# Patient Record
Sex: Female | Born: 1940 | Race: White | Hispanic: No | Smoking: Former smoker
Health system: Southern US, Community
[De-identification: ages and names within clinical notes are randomized; demographics above are authoritative.]

## PROBLEM LIST (undated history)

## (undated) DIAGNOSIS — I4891 Unspecified atrial fibrillation: Secondary | ICD-10-CM

## (undated) DIAGNOSIS — Z95828 Presence of other vascular implants and grafts: Secondary | ICD-10-CM

## (undated) DIAGNOSIS — G8190 Hemiplegia, unspecified affecting unspecified side: Secondary | ICD-10-CM

## (undated) DIAGNOSIS — M21969 Unspecified acquired deformity of unspecified lower leg: Secondary | ICD-10-CM

## (undated) DIAGNOSIS — K589 Irritable bowel syndrome without diarrhea: Secondary | ICD-10-CM

## (undated) DIAGNOSIS — R9430 Abnormal result of cardiovascular function study, unspecified: Secondary | ICD-10-CM

## (undated) DIAGNOSIS — IMO0002 Reserved for concepts with insufficient information to code with codable children: Secondary | ICD-10-CM

## (undated) DIAGNOSIS — Z8669 Personal history of other diseases of the nervous system and sense organs: Secondary | ICD-10-CM

## (undated) DIAGNOSIS — F0390 Unspecified dementia without behavioral disturbance: Secondary | ICD-10-CM

## (undated) DIAGNOSIS — F329 Major depressive disorder, single episode, unspecified: Secondary | ICD-10-CM

## (undated) DIAGNOSIS — I5189 Other ill-defined heart diseases: Secondary | ICD-10-CM

## (undated) DIAGNOSIS — F32A Depression, unspecified: Secondary | ICD-10-CM

## (undated) DIAGNOSIS — I351 Nonrheumatic aortic (valve) insufficiency: Secondary | ICD-10-CM

## (undated) DIAGNOSIS — J80 Acute respiratory distress syndrome: Secondary | ICD-10-CM

## (undated) DIAGNOSIS — I639 Cerebral infarction, unspecified: Secondary | ICD-10-CM

## (undated) DIAGNOSIS — D65 Disseminated intravascular coagulation [defibrination syndrome]: Secondary | ICD-10-CM

## (undated) DIAGNOSIS — E039 Hypothyroidism, unspecified: Secondary | ICD-10-CM

## (undated) DIAGNOSIS — I340 Nonrheumatic mitral (valve) insufficiency: Secondary | ICD-10-CM

## (undated) DIAGNOSIS — F419 Anxiety disorder, unspecified: Secondary | ICD-10-CM

## (undated) HISTORY — PX: APPENDECTOMY: SHX54

## (undated) HISTORY — DX: Presence of other vascular implants and grafts: Z95.828

## (undated) HISTORY — PX: FOOT SURGERY: SHX648

## (undated) HISTORY — PX: TONSILLECTOMY: SUR1361

## (undated) HISTORY — DX: Abnormal result of cardiovascular function study, unspecified: R94.30

## (undated) HISTORY — PX: OTHER SURGICAL HISTORY: SHX169

## (undated) HISTORY — DX: Nonrheumatic aortic (valve) insufficiency: I35.1

## (undated) HISTORY — DX: Nonrheumatic mitral (valve) insufficiency: I34.0

## (undated) HISTORY — DX: Unspecified acquired deformity of unspecified lower leg: M21.969

## (undated) HISTORY — DX: Unspecified atrial fibrillation: I48.91

## (undated) HISTORY — DX: Irritable bowel syndrome, unspecified: K58.9

## (undated) HISTORY — DX: Reserved for concepts with insufficient information to code with codable children: IMO0002

## (undated) HISTORY — DX: Other ill-defined heart diseases: I51.89

---

## 2007-01-16 ENCOUNTER — Ambulatory Visit: Payer: Self-pay | Admitting: Cardiology

## 2007-04-22 ENCOUNTER — Ambulatory Visit: Payer: Self-pay

## 2007-04-22 ENCOUNTER — Encounter: Payer: Self-pay | Admitting: Cardiology

## 2007-06-05 ENCOUNTER — Ambulatory Visit: Payer: Self-pay | Admitting: Cardiology

## 2007-06-07 ENCOUNTER — Emergency Department (HOSPITAL_COMMUNITY): Admission: EM | Admit: 2007-06-07 | Discharge: 2007-06-07 | Payer: Self-pay | Admitting: General Surgery

## 2007-06-30 ENCOUNTER — Emergency Department (HOSPITAL_COMMUNITY): Admission: EM | Admit: 2007-06-30 | Discharge: 2007-06-30 | Payer: Self-pay | Admitting: Emergency Medicine

## 2007-10-10 ENCOUNTER — Telehealth (INDEPENDENT_AMBULATORY_CARE_PROVIDER_SITE_OTHER): Payer: Self-pay | Admitting: *Deleted

## 2007-12-10 ENCOUNTER — Other Ambulatory Visit: Payer: Self-pay | Admitting: Emergency Medicine

## 2007-12-10 ENCOUNTER — Other Ambulatory Visit: Payer: Self-pay

## 2007-12-10 ENCOUNTER — Inpatient Hospital Stay (HOSPITAL_COMMUNITY): Admission: RE | Admit: 2007-12-10 | Discharge: 2007-12-12 | Payer: Self-pay | Admitting: Psychiatry

## 2007-12-10 ENCOUNTER — Ambulatory Visit: Payer: Self-pay | Admitting: Psychiatry

## 2008-01-15 ENCOUNTER — Ambulatory Visit: Payer: Self-pay | Admitting: Internal Medicine

## 2008-01-15 DIAGNOSIS — I69959 Hemiplegia and hemiparesis following unspecified cerebrovascular disease affecting unspecified side: Secondary | ICD-10-CM | POA: Insufficient documentation

## 2008-01-15 DIAGNOSIS — F341 Dysthymic disorder: Secondary | ICD-10-CM | POA: Insufficient documentation

## 2008-01-15 DIAGNOSIS — M549 Dorsalgia, unspecified: Secondary | ICD-10-CM | POA: Insufficient documentation

## 2008-01-15 DIAGNOSIS — K352 Acute appendicitis with generalized peritonitis, without abscess: Secondary | ICD-10-CM

## 2008-01-15 DIAGNOSIS — E039 Hypothyroidism, unspecified: Secondary | ICD-10-CM | POA: Insufficient documentation

## 2008-01-15 DIAGNOSIS — J984 Other disorders of lung: Secondary | ICD-10-CM | POA: Insufficient documentation

## 2008-03-29 ENCOUNTER — Ambulatory Visit (HOSPITAL_BASED_OUTPATIENT_CLINIC_OR_DEPARTMENT_OTHER): Admission: RE | Admit: 2008-03-29 | Discharge: 2008-03-29 | Payer: Self-pay | Admitting: Internal Medicine

## 2008-03-29 ENCOUNTER — Ambulatory Visit: Payer: Self-pay | Admitting: Internal Medicine

## 2008-03-29 DIAGNOSIS — K5909 Other constipation: Secondary | ICD-10-CM | POA: Insufficient documentation

## 2008-03-29 DIAGNOSIS — M25569 Pain in unspecified knee: Secondary | ICD-10-CM | POA: Insufficient documentation

## 2008-03-29 LAB — CONVERTED CEMR LAB
ALT: 22 units/L (ref 0–35)
AST: 19 units/L (ref 0–37)
Albumin: 4 g/dL (ref 3.5–5.2)
BUN: 21 mg/dL (ref 6–23)
Basophils Absolute: 0 10*3/uL (ref 0.0–0.1)
Basophils Relative: 0.4 % (ref 0.0–3.0)
CO2: 28 meq/L (ref 19–32)
Calcium: 9 mg/dL (ref 8.4–10.5)
Creatinine, Ser: 0.8 mg/dL (ref 0.4–1.2)
Eosinophils Absolute: 0.1 10*3/uL (ref 0.0–0.7)
Eosinophils Relative: 1.6 % (ref 0.0–5.0)
GFR calc non Af Amer: 77 mL/min
Hemoglobin: 12.8 g/dL (ref 12.0–15.0)
MCHC: 35.1 g/dL (ref 30.0–36.0)
MCV: 91.8 fL (ref 78.0–100.0)
Neutro Abs: 4 10*3/uL (ref 1.4–7.7)
RBC: 3.99 M/uL (ref 3.87–5.11)
TSH: 1.49 microintl units/mL (ref 0.35–5.50)
Total Bilirubin: 0.4 mg/dL (ref 0.3–1.2)
WBC: 6.7 10*3/uL (ref 4.5–10.5)

## 2008-03-30 ENCOUNTER — Telehealth: Payer: Self-pay | Admitting: Internal Medicine

## 2008-06-28 ENCOUNTER — Telehealth: Payer: Self-pay | Admitting: Internal Medicine

## 2008-06-29 ENCOUNTER — Telehealth (INDEPENDENT_AMBULATORY_CARE_PROVIDER_SITE_OTHER): Payer: Self-pay | Admitting: *Deleted

## 2008-07-09 ENCOUNTER — Ambulatory Visit: Payer: Self-pay | Admitting: Cardiology

## 2008-07-16 ENCOUNTER — Ambulatory Visit: Payer: Self-pay | Admitting: Internal Medicine

## 2008-07-16 ENCOUNTER — Telehealth: Payer: Self-pay | Admitting: Internal Medicine

## 2008-07-16 DIAGNOSIS — M810 Age-related osteoporosis without current pathological fracture: Secondary | ICD-10-CM | POA: Insufficient documentation

## 2008-07-25 ENCOUNTER — Ambulatory Visit: Payer: Self-pay | Admitting: Diagnostic Radiology

## 2008-07-25 ENCOUNTER — Emergency Department (HOSPITAL_BASED_OUTPATIENT_CLINIC_OR_DEPARTMENT_OTHER): Admission: EM | Admit: 2008-07-25 | Discharge: 2008-07-25 | Payer: Self-pay | Admitting: Emergency Medicine

## 2008-08-04 ENCOUNTER — Emergency Department (HOSPITAL_BASED_OUTPATIENT_CLINIC_OR_DEPARTMENT_OTHER): Admission: EM | Admit: 2008-08-04 | Discharge: 2008-08-04 | Payer: Self-pay | Admitting: Emergency Medicine

## 2008-09-07 IMAGING — CR DG CHEST 1V PORT
1 series · 1 of 1 positions shown · non-contrast
Comparison: 06/07/07.

CLINICAL DATA: Altered level of consciousness.
 PORTABLE CHEST:

[AP]
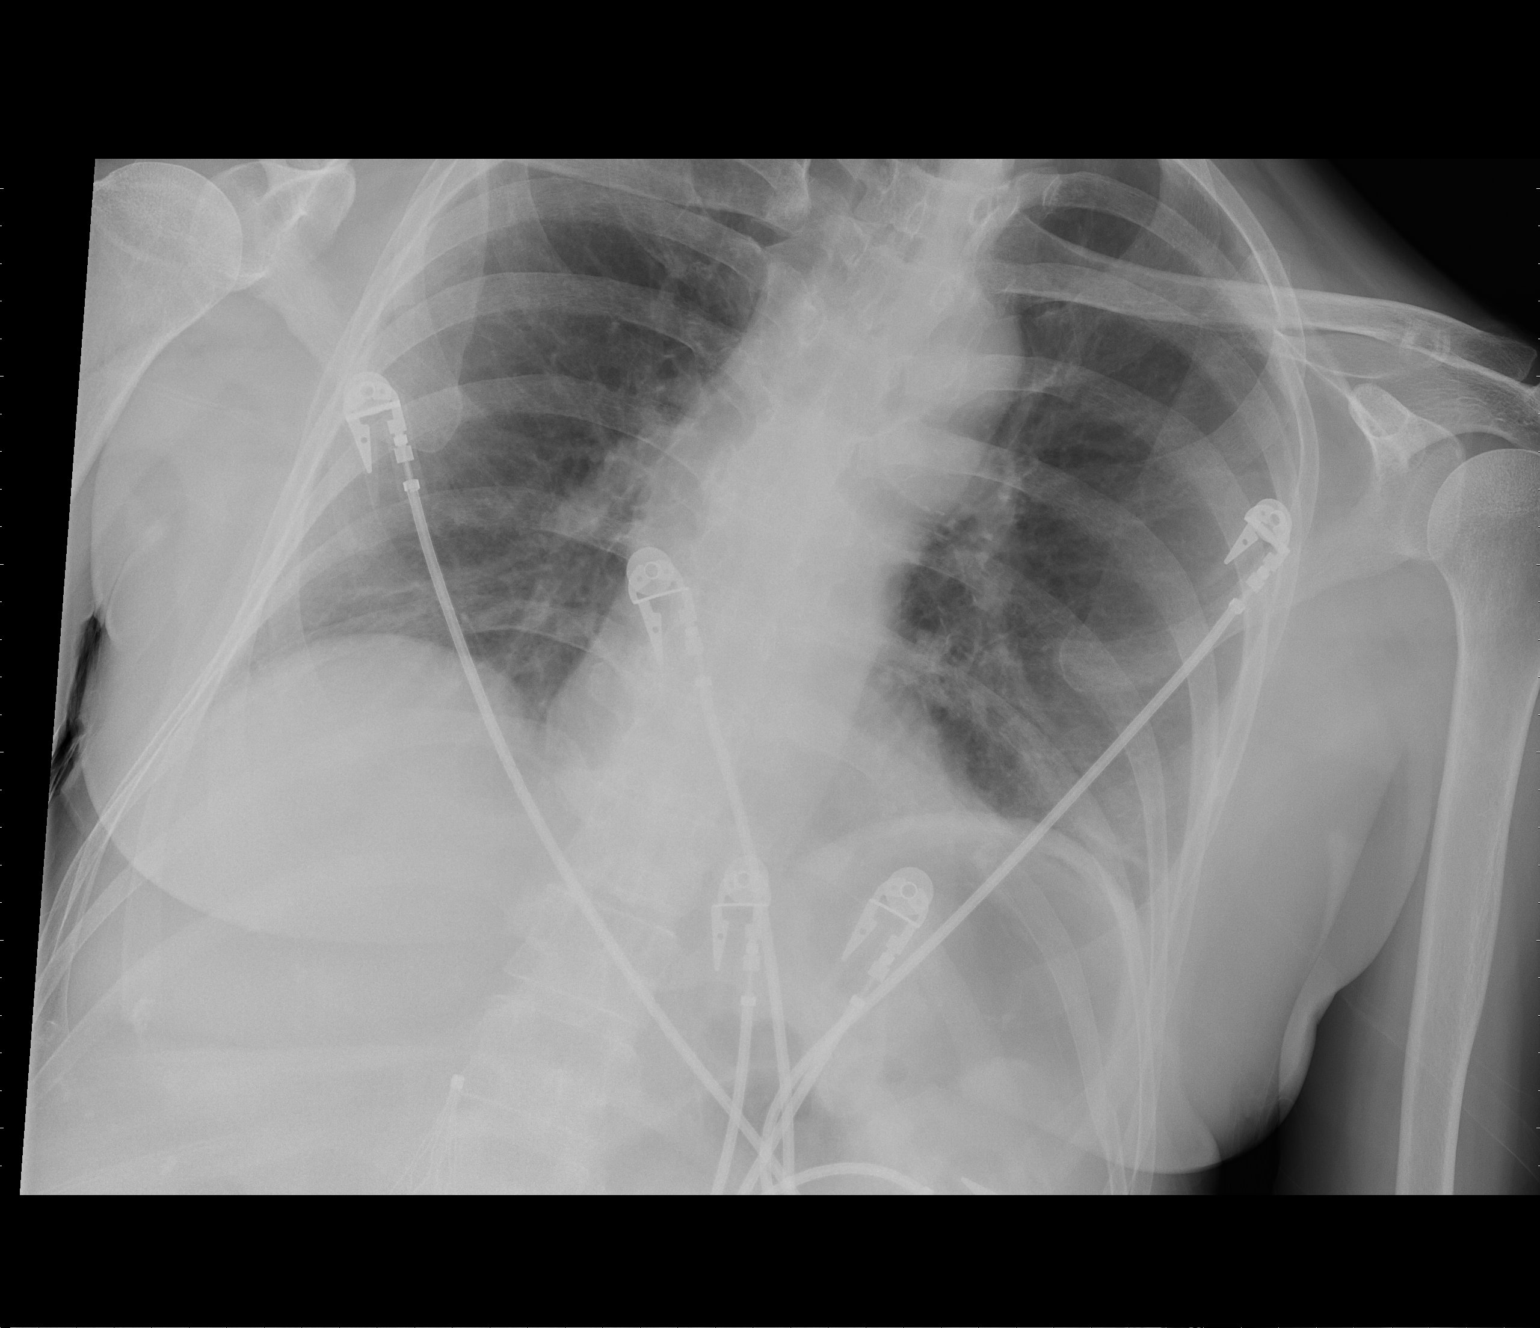

[1 of 1 positions shown; findings below may reference images not displayed]

FINDINGS: The cardiomediastinal silhouette is stable.  The study is limited by poor inspiration.  There is bilateral basilar atelectasis.  There is no acute infiltrate or pleural effusion.  There is no pulmonary edema.  An IVC filter is noted in visualized abdomen.
IMPRESSION: Limited exam by poor inspiration.  No acute infiltrate.  Bilateral basilar atelectasis.

## 2008-09-07 IMAGING — CT CT HEAD W/O CM
1 series · 16 of 30 positions shown, 20 images · non-contrast
Comparison: None

CLINICAL DATA: Altered level of consciousness

HEAD CT WITHOUT CONTRAST
TECHNIQUE: 5mm collimated images were obtained from the base of the skull
through the vertex according to standard protocol without contrast.

[Series 2: head routine 4.8 h47s · axial · 0.39mm/px · z∈[-115,+16]mm · 16 of 30 slices shown, 20 images]
[im 2/30  brain]
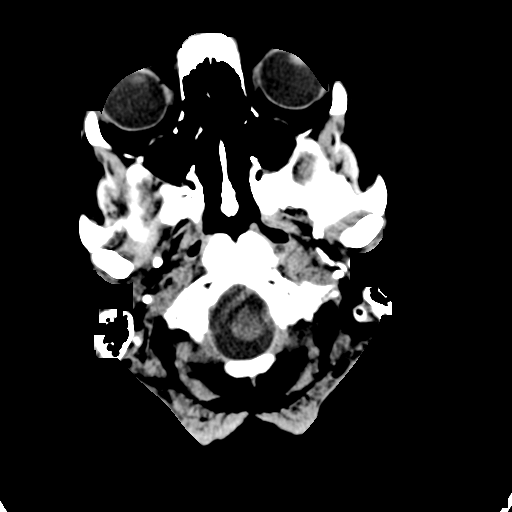
[im 2/30  bone]
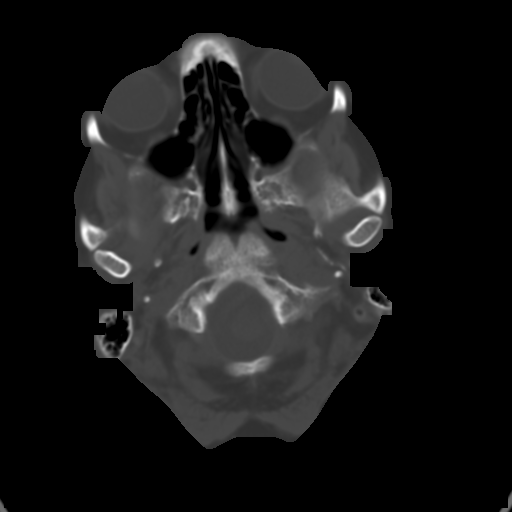
[im 4/30  brain]
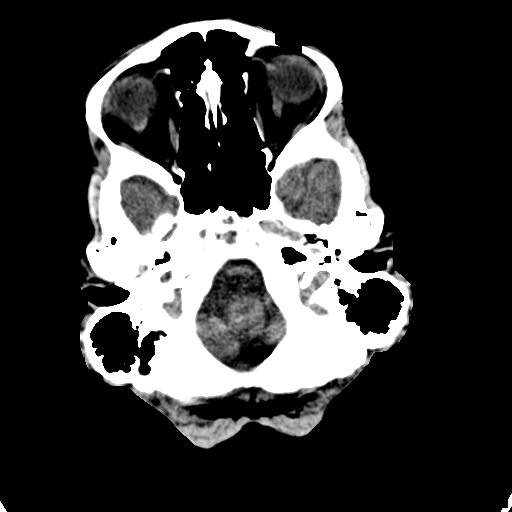
[im 6/30  brain]
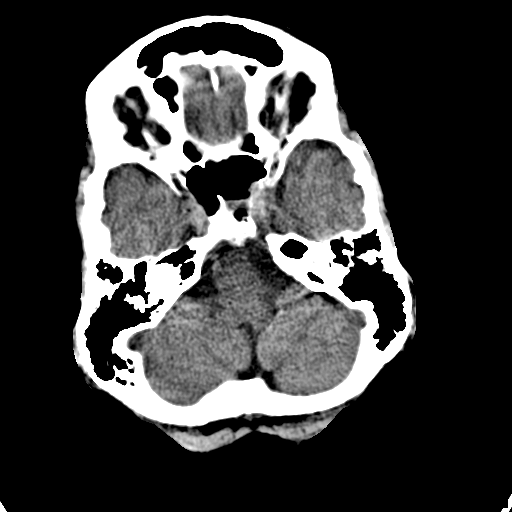
[im 8/30  brain]
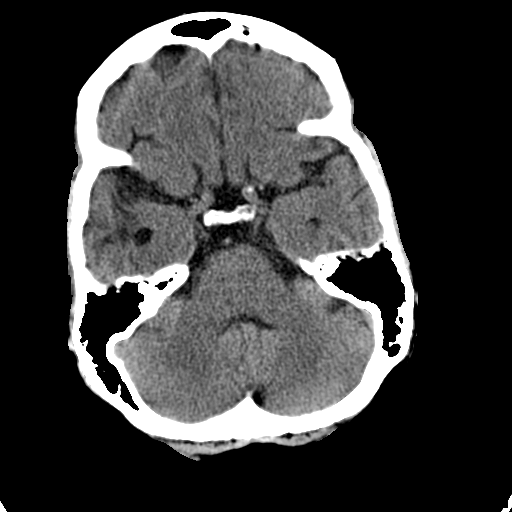
[im 9/30  brain]
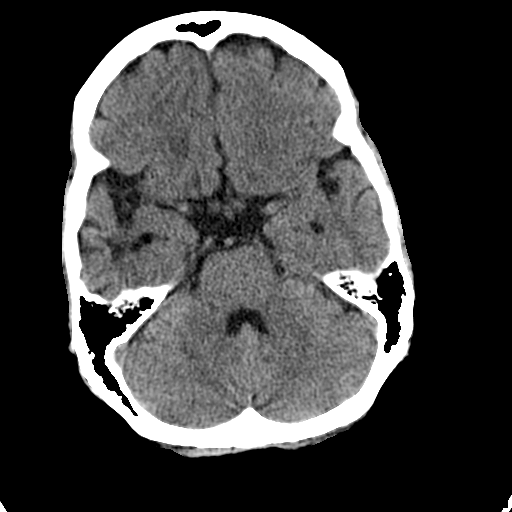
[im 9/30  bone]
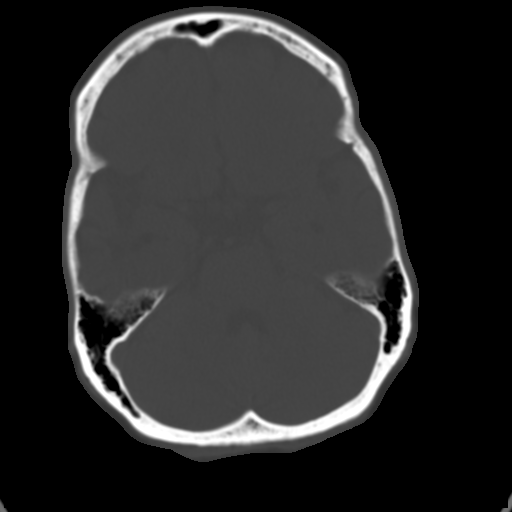
[im 11/30  brain]
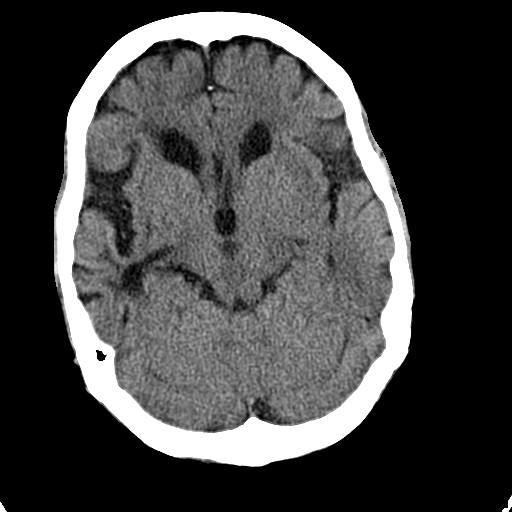
[im 13/30  brain]
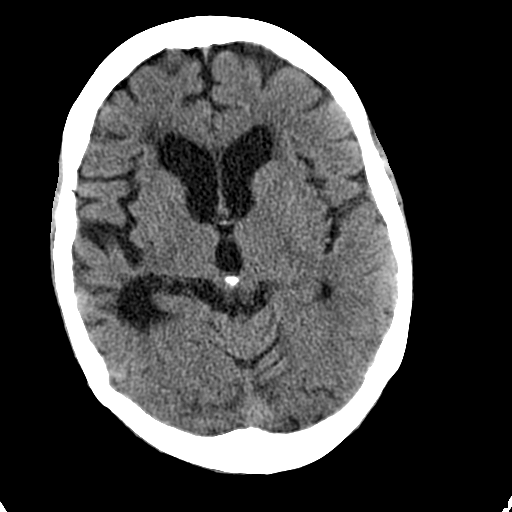
[im 15/30  brain]
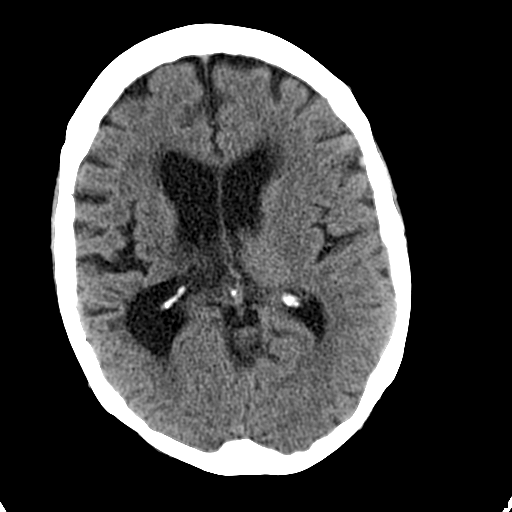
[im 16/30  brain]
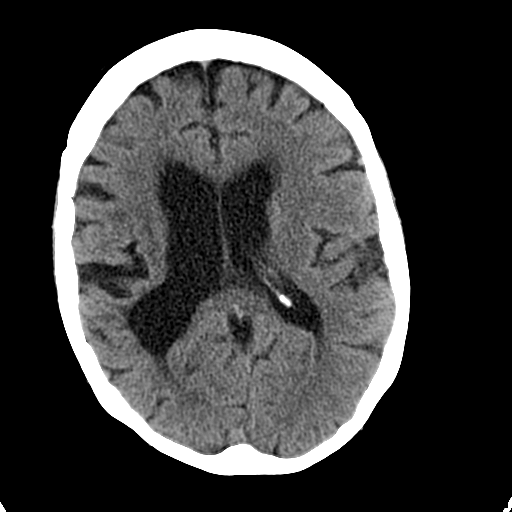
[im 16/30  bone]
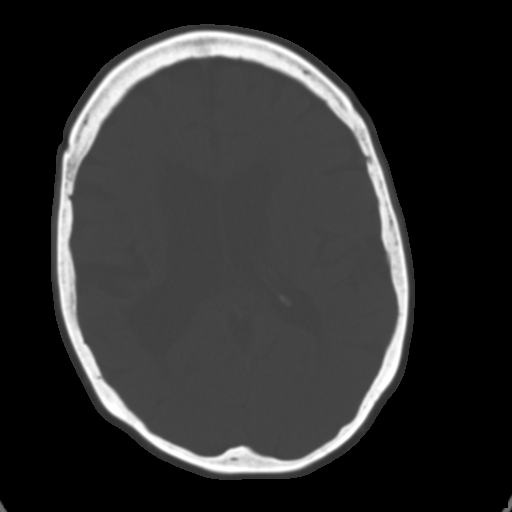
[im 18/30  brain]
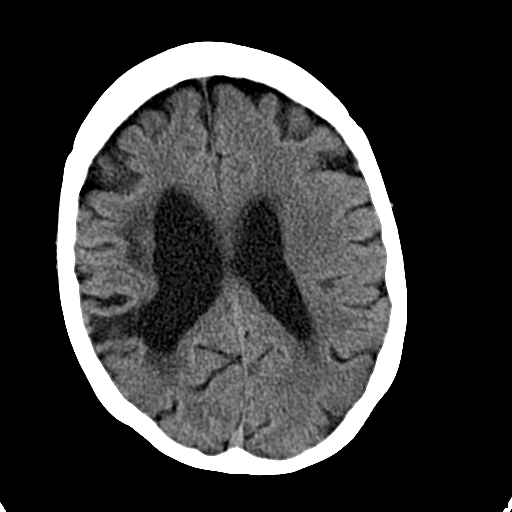
[im 20/30  brain]
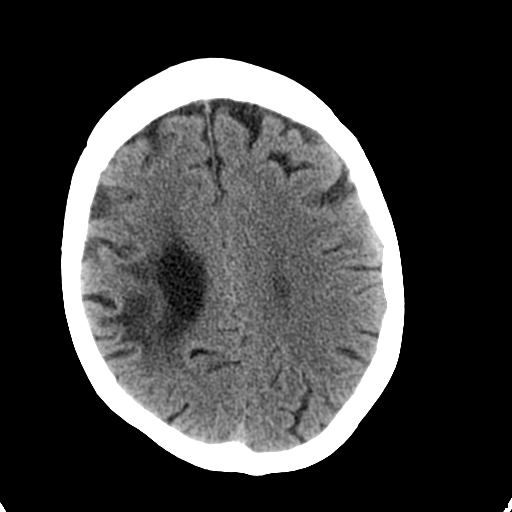
[im 22/30  brain]
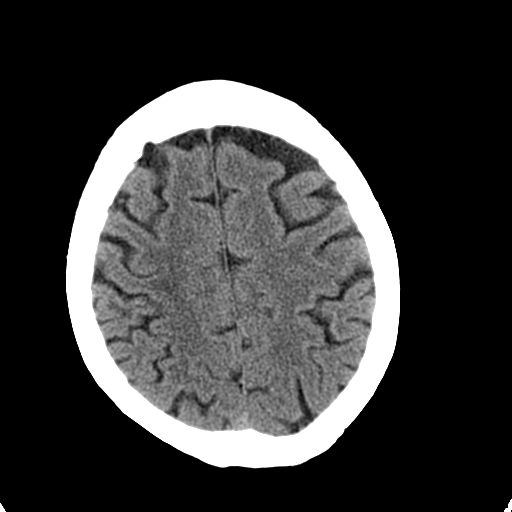
[im 23/30  brain]
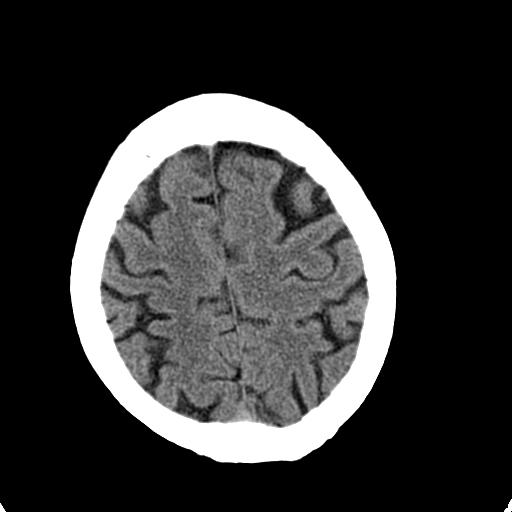
[im 23/30  bone]
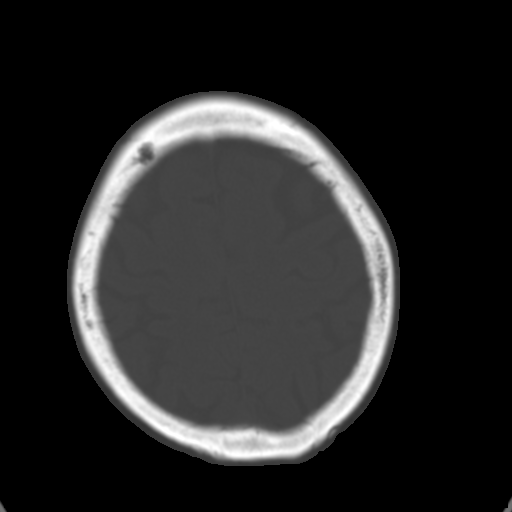
[im 25/30  brain]
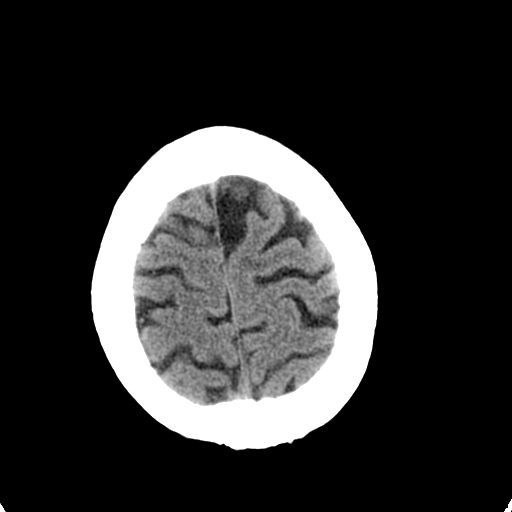
[im 27/30  brain]
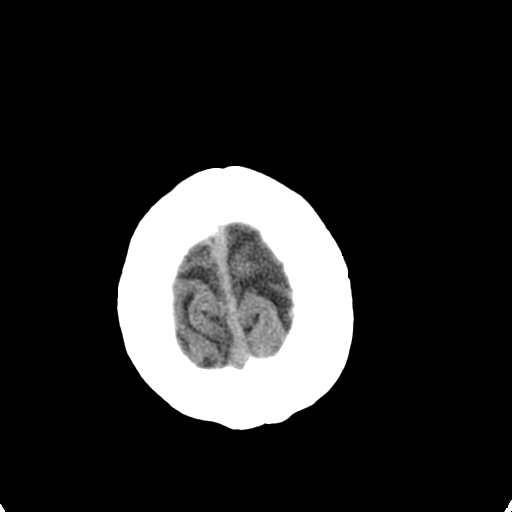
[im 29/30  brain]
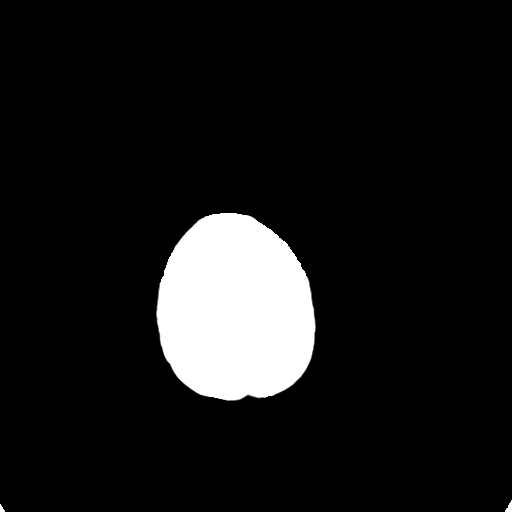

[16 of 30 positions shown; findings below may reference images not displayed]

FINDINGS: There is advanced cerebral atrophy. Chronic small vessel disease
changes noted in the deep white matter as well, right greater than left. There
is old right MCA infarct in the posterior right temporal lobe. No hemorrhage,
hydrocephalus, mass lesion, or acute infarct. Visualized paranasal sinuses and
calvarium unremarkable.

IMPRESSION

Advanced atrophy and chronic small vessel disease.

Old small right MCA infarct.

No acute intracranial abnormality.

## 2008-10-08 ENCOUNTER — Telehealth: Payer: Self-pay | Admitting: Cardiology

## 2008-10-20 ENCOUNTER — Telehealth: Payer: Self-pay | Admitting: Cardiology

## 2008-11-21 ENCOUNTER — Emergency Department (HOSPITAL_BASED_OUTPATIENT_CLINIC_OR_DEPARTMENT_OTHER): Admission: EM | Admit: 2008-11-21 | Discharge: 2008-11-21 | Payer: Self-pay | Admitting: Emergency Medicine

## 2008-11-21 ENCOUNTER — Ambulatory Visit: Payer: Self-pay | Admitting: Diagnostic Radiology

## 2009-05-03 ENCOUNTER — Ambulatory Visit: Payer: Self-pay | Admitting: Diagnostic Radiology

## 2009-05-03 ENCOUNTER — Emergency Department (HOSPITAL_BASED_OUTPATIENT_CLINIC_OR_DEPARTMENT_OTHER): Admission: EM | Admit: 2009-05-03 | Discharge: 2009-05-03 | Payer: Self-pay | Admitting: Emergency Medicine

## 2009-05-12 ENCOUNTER — Encounter (INDEPENDENT_AMBULATORY_CARE_PROVIDER_SITE_OTHER): Payer: Self-pay | Admitting: *Deleted

## 2009-05-17 ENCOUNTER — Emergency Department (HOSPITAL_BASED_OUTPATIENT_CLINIC_OR_DEPARTMENT_OTHER): Admission: EM | Admit: 2009-05-17 | Discharge: 2009-05-17 | Payer: Self-pay | Admitting: Emergency Medicine

## 2009-11-08 ENCOUNTER — Telehealth: Payer: Self-pay | Admitting: Cardiology

## 2009-11-10 ENCOUNTER — Encounter (INDEPENDENT_AMBULATORY_CARE_PROVIDER_SITE_OTHER): Payer: Self-pay | Admitting: *Deleted

## 2009-11-17 ENCOUNTER — Telehealth (INDEPENDENT_AMBULATORY_CARE_PROVIDER_SITE_OTHER): Payer: Self-pay | Admitting: *Deleted

## 2009-11-17 ENCOUNTER — Telehealth: Payer: Self-pay | Admitting: Cardiology

## 2010-01-11 ENCOUNTER — Encounter: Payer: Self-pay | Admitting: Cardiology

## 2010-03-22 ENCOUNTER — Encounter: Payer: Self-pay | Admitting: Cardiology

## 2010-03-23 ENCOUNTER — Ambulatory Visit: Payer: Self-pay | Admitting: Cardiology

## 2010-05-30 ENCOUNTER — Emergency Department (HOSPITAL_BASED_OUTPATIENT_CLINIC_OR_DEPARTMENT_OTHER)
Admission: EM | Admit: 2010-05-30 | Discharge: 2010-05-30 | Disposition: A | Discharge status: Other Acute Inpatient Hospital | Payer: Self-pay | Source: Home / Self Care | Admitting: Emergency Medicine

## 2010-05-30 ENCOUNTER — Inpatient Hospital Stay (HOSPITAL_COMMUNITY)
Admission: EM | Admit: 2010-05-30 | Discharge: 2010-06-02 | Payer: Self-pay | Attending: Internal Medicine | Admitting: Internal Medicine

## 2010-05-31 LAB — COMPREHENSIVE METABOLIC PANEL
ALT: 23 U/L (ref 0–35)
AST: 21 U/L (ref 0–37)
Albumin: 4.1 g/dL (ref 3.5–5.2)
Alkaline Phosphatase: 74 U/L (ref 39–117)
BUN: 18 mg/dL (ref 6–23)
CO2: 26 mEq/L (ref 19–32)
Calcium: 9.5 mg/dL (ref 8.4–10.5)
Chloride: 109 mEq/L (ref 96–112)
Creatinine, Ser: 0.8 mg/dL (ref 0.4–1.2)
GFR calc Af Amer: >60 mL/min (ref >60)
GFR calc non Af Amer: >60 mL/min (ref >60)
Glucose, Bld: 101 mg/dL — ABNORMAL HIGH (ref 70–99)
Potassium: 4.1 mEq/L (ref 3.5–5.1)
Sodium: 147 mEq/L — ABNORMAL HIGH (ref 135–145)
Total Bilirubin: 0.5 mg/dL (ref 0.3–1.2)
Total Protein: 6.9 g/dL (ref 6.0–8.3)

## 2010-05-31 LAB — PROCALCITONIN: Procalcitonin: <0.1 ng/mL

## 2010-05-31 LAB — URINE MICROSCOPIC-ADD ON

## 2010-05-31 LAB — CBC
HCT: 38.2 % (ref 36.0–46.0)
MCH: 29.1 pg (ref 26.0–34.0)
MCHC: 33.2 g/dL (ref 30.0–36.0)
MCV: 87.4 fL (ref 78.0–100.0)
Platelets: 289 10*3/uL (ref 150–400)
RBC: 4.37 MIL/uL (ref 3.87–5.11)
WBC: 10.2 10*3/uL (ref 4.0–10.5)

## 2010-05-31 LAB — URINALYSIS, ROUTINE W REFLEX MICROSCOPIC
Bilirubin Urine: NEGATIVE
Hgb urine dipstick: NEGATIVE
Ketones, ur: NEGATIVE mg/dL
Nitrite: NEGATIVE
Protein, ur: NEGATIVE mg/dL
Specific Gravity, Urine: 1.018 (ref 1.005–1.030)
Urine Glucose, Fasting: NEGATIVE mg/dL
Urobilinogen, UA: 1 mg/dL (ref 0.0–1.0)
pH: 6 (ref 5.0–8.0)

## 2010-05-31 LAB — DIFFERENTIAL
Basophils Absolute: 0 10*3/uL (ref 0.0–0.1)
Basophils Relative: 0 % (ref 0–1)
Eosinophils Absolute: 0.3 10*3/uL (ref 0.0–0.7)
Eosinophils Relative: 3 % (ref 0–5)
Lymphocytes Relative: 26 % (ref 12–46)
Lymphs Abs: 2.7 10*3/uL (ref 0.7–4.0)
Neutrophils Relative %: 63 % (ref 43–77)

## 2010-05-31 LAB — LACTIC ACID, PLASMA: Lactic Acid, Venous: 0.9 mmol/L (ref 0.5–2.2)

## 2010-06-02 NOTE — Consult Note (Signed)
[name], MCBREEN NO.:  [id number]  MEDICAL RECORD NO.:  [id number]          PATIENT TYPE:  INP  LOCATION:  4708                         FACILITY:  [facility]  PHYSICIAN:  Alvy Beal, MD    DATE OF BIRTH:  1940-10-06  DATE OF CONSULTATION:  05/31/2010 DATE OF DISCHARGE:                                CONSULTATION   ADMITTING DIAGNOSIS:  Cellulitis, possible osteomyelitis, of the left foot.  HISTORY:  This is a 70 year old woman who presented to the [facility] because of redness, erythema, and warmth in her left foot, fairly acute onset.  The patient states that she had a corn at the base of her left great toe which peeled off while she was in the shower 3 days earlier.  The patient states that she started noticing swelling and tenderness with weightbearing over the last 2 days.  Because of this, she presented to the emergency room.  The patient had been seen by my partner, Dr. Lestine Box, in the past and so I was consulted as the patient has requested Dr. Lestine Box to be involved in her care.  The patient had x-rays which demonstrated a questionable lucency around the screws which were read as either loosening or a possible osteo. There are no other previous x-rays to compare to.  The patient had a multi-complex surgical reconstruction of her foot back somewhere between 9-11 years ago, she is really unclear as to exact date.  Because of the low-grade fever and the swelling, she was transferred to [facility] for ongoing care.  She was admitted by the hospitalist service.  PAST MEDICAL HISTORY: 1. Hypothyroidism. 2. History of stroke. 3. Left-sided hemiparesis. 4. History of ARDS. 5. Acute appendicitis with generalized peritonitis in the past. 6. Depression. 7. Anxiety. 8. Chronic back pain. 9. Paroxysmal atrial fibrillation. 10.Diastolic dysfunction which is chronic. 11.Tricuspid regurgitation. 12.Aortic insufficiency. 13.IVC filter in  2000. 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 She has had an appendectomy, breast biopsy, reconstructive foot surgery in I believe 2001, bladder suspension surgery, IVC filter placement.  She has recently relocated here to [place] area; however, all of her foot surgery was done in [place].  CLINICAL EXAMINATION:  GENERAL:  She is currently in bed resting comfortably.  She has no shortness of breath, chest pain. ABDOMEN:  Soft, nontender. EXTREMITIES:  She has no hip, knee, or ankle pain with joint range of motion, bilaterally intact sensation to light touch throughout.  There is a well-healed surgical scar over the dorsum of the foot.  There is slight plantar dorsal corn over her great toe, but there is no drainage. There is some minor warmth and erythema and swelling, but she does not have significant pain with palpation of the forefoot, midfoot, or hind foot.  X-rays were reviewed.  There does appear to be a questionable lucency which could be read as an infection versus just lucency of the screws.  At this point, this is a very complicated issue.  I do agree with treating the cellulitis which she has with IV antibiotics and immobilization.  I will  discuss the case with my partner, Dr. Lestine Box, and defer definitive ongoing care to him.  I will make sure he is aware of the patient today, and hopefully, he will see her tomorrow.     Alvy Beal, MD     DDB/MEDQ  D:  05/31/2010  T:  06/01/2010  Job:  [id number]  Electronically Signed by Venita Lick MD on 06/01/2010 08:48:10 PM

## 2010-06-04 ENCOUNTER — Emergency Department (HOSPITAL_BASED_OUTPATIENT_CLINIC_OR_DEPARTMENT_OTHER)
Admission: EM | Admit: 2010-06-04 | Discharge: 2010-06-04 | Payer: Self-pay | Source: Home / Self Care | Admitting: Emergency Medicine

## 2010-06-05 LAB — COMPREHENSIVE METABOLIC PANEL
ALT: 13 U/L (ref 0–35)
AST: 15 U/L (ref 0–37)
AST: 13 U/L (ref 0–37)
Albumin: 2.8 g/dL — ABNORMAL LOW (ref 3.5–5.2)
Alkaline Phosphatase: 45 U/L (ref 39–117)
BUN: 13 mg/dL (ref 6–23)
CO2: 24 mEq/L (ref 19–32)
CO2: 26 mEq/L (ref 19–32)
Calcium: 8.3 mg/dL — ABNORMAL LOW (ref 8.4–10.5)
Calcium: 8.3 mg/dL — ABNORMAL LOW (ref 8.4–10.5)
Chloride: 111 mEq/L (ref 96–112)
Creatinine, Ser: 0.86 mg/dL (ref 0.4–1.2)
Creatinine, Ser: 0.84 mg/dL (ref 0.4–1.2)
GFR calc Af Amer: >60 mL/min (ref >60)
GFR calc Af Amer: >60 mL/min (ref >60)
GFR calc non Af Amer: >60 mL/min (ref >60)
GFR calc non Af Amer: >60 mL/min (ref >60)
Glucose, Bld: 105 mg/dL — ABNORMAL HIGH (ref 70–99)
Potassium: 3.7 mEq/L (ref 3.5–5.1)
Sodium: 143 mEq/L (ref 135–145)
Total Bilirubin: 0.7 mg/dL (ref 0.3–1.2)
Total Protein: 5 g/dL — ABNORMAL LOW (ref 6.0–8.3)

## 2010-06-05 LAB — CBC
HCT: 33.1 % — ABNORMAL LOW (ref 36.0–46.0)
HCT: 36.5 % (ref 36.0–46.0)
Hemoglobin: 10.7 g/dL — ABNORMAL LOW (ref 12.0–15.0)
Hemoglobin: 12.2 g/dL (ref 12.0–15.0)
MCH: 29.2 pg (ref 26.0–34.0)
MCH: 29.8 pg (ref 26.0–34.0)
MCHC: 32.3 g/dL (ref 30.0–36.0)
MCHC: 33.4 g/dL (ref 30.0–36.0)
MCV: 90.2 fL (ref 78.0–100.0)
MCV: 89 fL (ref 78.0–100.0)
Platelets: 230 10*3/uL (ref 150–400)
Platelets: 259 10*3/uL (ref 150–400)
RBC: 3.67 MIL/uL — ABNORMAL LOW (ref 3.87–5.11)
RBC: 4.1 MIL/uL (ref 3.87–5.11)
RDW: 13.7 % (ref 11.5–15.5)
RDW: 13.4 % (ref 11.5–15.5)
WBC: 6.7 10*3/uL (ref 4.0–10.5)
WBC: 6.3 10*3/uL (ref 4.0–10.5)

## 2010-06-05 LAB — SEDIMENTATION RATE: Sed Rate: 7 mm/hr (ref 0–22)

## 2010-06-05 LAB — URINE CULTURE
Colony Count: 25000
Culture  Setup Time: 201201180544

## 2010-06-05 LAB — MRSA PCR SCREENING: MRSA by PCR: NEGATIVE

## 2010-06-05 LAB — URIC ACID: Uric Acid, Serum: 4.4 mg/dL (ref 2.4–7.0)

## 2010-06-06 LAB — CULTURE, BLOOD (ROUTINE X 2)
Culture  Setup Time: 201201172342
Culture  Setup Time: 201201172342
Culture: NO GROWTH
Culture: NO GROWTH

## 2010-06-06 LAB — DIFFERENTIAL
Lymphocytes Relative: 33 % (ref 12–46)
Lymphs Abs: 2.1 10*3/uL (ref 0.7–4.0)
Monocytes Relative: 8 % (ref 3–12)
Neutro Abs: 3.6 10*3/uL (ref 1.7–7.7)
Neutrophils Relative %: 56 % (ref 43–77)

## 2010-06-06 LAB — CBC
HCT: 36.3 % (ref 36.0–46.0)
Hemoglobin: 12.2 g/dL (ref 12.0–15.0)
MCH: 29.3 pg (ref 26.0–34.0)
MCV: 87.3 fL (ref 78.0–100.0)
RBC: 4.16 MIL/uL (ref 3.87–5.11)

## 2010-06-07 LAB — CULTURE, BLOOD (ROUTINE X 2)
Culture  Setup Time: 201201180918
Culture: NO GROWTH

## 2010-06-08 NOTE — Discharge Summary (Addendum)
  [name], PATES NO.:  [id number]  MEDICAL RECORD NO.:  [id number]           PATIENT TYPE:  LOCATION:                                 FACILITY:  PHYSICIAN:  Dana Franko Bosie Helper, MD      DATE OF BIRTH:  [birth year]  DATE OF ADMISSION:  05/31/2010 DATE OF DISCHARGE:                              DISCHARGE SUMMARY   PRIMARY CARDIOLOGIST:  Dana Abed, MD, [facility]  PRIMARY GASTROENTEROLOGIST:  Dr. Alison Griffin at [facility].  DISCHARGE DIAGNOSES: 1. Left foot cellulitis. 2. Leukocytosis, resolved. 3. Hypothyroidism. 4. History of cerebrovascular accident with left-sided hemiparesis. 5. History of appendicitis with peritonitis. 6. Depression and anxiety disorder. 7. Paroxysmal atrial fibrillation, now resolved.  EF was normal in     2005.  DISCHARGE MEDICATIONS: 1. Doxycycline 100 mg p.o. b.i.d. for 5 days. 2. Keflex 500 mg p.o. t.i.d. for 5 days. 3. The patient will continue her home medications.  HISTORY OF PRESENT ILLNESS:  This is a 70 year old female who presented to the [facility] because of redness, erythema, and warmness on her left leg foot which was very acute in onset.  The patient states she had corn at the base of her great toe which peeled off while she was in the shower 3 days ago.  The corn has since then deepithelialized, but this afternoon, the patient noted that she had redness around it, it starting from the base of the first toe all the way down into the dorsal aspect of her ankle, presented to [facility] and x-ray confirmed she had cellulitis of the medial aspect of her left first toe.  The patient has a history of congenital dead foot with a screw traversing the first metatarsal and medial cuneiform and had lucency along its periphery.  This may represent potential loosening or potential infection.  The patient is admitted to the hospital, started on Zosyn and vancomycin, but Orthopedics consulted.   The patient usually sees Dr. Lestine Griffin and Infectious Disease consulted, it was done by Dr. Orvan Griffin.  Dr. Orvan Griffin agreed with treating the patient as cellulitis and the infection did not seem deep on the soft tissue.  The patient responded to vancomycin with clearing of all the infection, clearing of all the erythema and the pain and per Dr. Orvan Griffin recommendation, the patient yet to be discharged on doxycycline and Keflex.  The patient will need to follow up with Dr. Lestine Griffin next week.  In addition, need to follow up with her primary care physician. Leukocytosis completed resolved and no evidence of fever.  Currently, it was felt that the patient is medically stable for discharge.     Dana Quinter Bosie Helper, MD     HIE/MEDQ  D:  06/04/2010  T:  06/04/2010  Job:  [id number]  Electronically Signed by Dana Cargo MD on 06/08/2010 01:10:00 PM

## 2010-06-09 NOTE — Consult Note (Signed)
[name], [name] NO.:  [id number]  MEDICAL RECORD NO.:  [id number]          PATIENT TYPE:  INP  LOCATION:  4708                         FACILITY:  [facility]  PHYSICIAN:  Leonides Grills, M.D.     DATE OF BIRTH:  March 26, 1941  DATE OF CONSULTATION:  06/01/2010 DATE OF DISCHARGE:                                CONSULTATION   CHIEF COMPLAINT:  Left foot cellulitis.  HISTORY OF PRESENT ILLNESS:  A 70 year old female well known to our service who was consulted by [facility] for left foot cellulitis. The patient with history of left foot reconstruction in 2001 done at [place].  The patient does report that she feels better since receiving an IV antibiotics, feels redness of foot is diminishing.  The patient reports 2 days ago, she developed redness of the left foot after "cutting and digging around" left great toenail.  The patient had soreness around the nail prior to trimming the nail.  No purulence. Also reports a small scab that has been present on the dorsal aspect of left foot for number of years that may be a portion of this washed off in the shower.  The patient was admitted on May 31, 2010 for cellulitis of the left foot and IV antibiotics were started.  PAST MEDICAL HISTORY: 1. Hypothyroidism. 2. History of CVA with left-sided hemiparesis at the time of critical     illness. 3. History of ARDS. 4. History of acute appendicitis with generalized peritonitis. 5. Depression. 6. Anxiety. 7. Chronic back pain. 8. Paroxysmal atrial fibrillation, now resolved, normally of last echo     in 2005. 9. Diastolic dysfunction. 10.Tricuspid regurgitation. 11.Aortic insufficiency. 12.IVC filter in 2000. 13.Left foot extensive reconstruction in 2001, Dr. Madelin Rear in the [facility].  PAST SURGICAL HISTORY: 1. Appendectomy. 2. Breast biopsy. 3. Reconstructive foot surgery. 4. Bladder suspension surgery. 5. IVC filter.  ALLERGIES:  None.  HOME  MEDICATIONS:  Synthroid, Klonopin, Ambien, Remeron, and Actonel.  REVIEW OF SYSTEMS:  Please see past medical history.  The patient with fevers at the time of admission, none currently.  PHYSICAL EXAMINATION:  VITAL SIGNS:  The patient currently afebrile. Stable.  White count on admission was 10,200 and currently 6300. GENERAL:  The patient is a well-developed and well-nourished female in no acute distress. PSYCHIATRY:  Alert and oriented x3. NEUROLOGIC:  Bilateral feet.  Sensation intact L4 through S1. VASCULAR:  Calves supple, nontender.  Dorsal pedal pulses 2+ bilaterally. SKIN:  She has slight erythema in dorsal aspect of left foot particularly over the distal metatarsal down to the left great toe. There is no red streaking of the leg.  There is a callous just over the dorsum of her left foot near the metatarsal head region.  Otherwise, no rashes, skin lesions, ulcerations of bilateral lower extremities.  There is no temperature variants between the two feet. EXTREMITIES:  Bilateral lower extremities; well-healed surgical incisions in left foot.  No prominence of hardware with palpation of left foot.  She is tender along the medial border of the left great toenail, no  expressible purulence.  The remainder of foot is nontender. The great toe in flexed plantarly through the IP joint.  RADIOGRAPHY:  Radiographs of the left foot are reviewed shows probable cellulitis in medial aspect of the first toe.  Prior extensive left foot reconstruction without any evidence of hardware failure.  No acute fractures identified.  ASSESSMENT AND PLAN:  A 70 year old female with left foot cellulitis, questionable left great toe ingrown nail source.  PLAN: 1. Warm soaks twice daily. 2. Continue IV antibiotics. 3. Follow up with Dr. Lestine Box in 1 week in the office, call [phone]     for appointment. 4. Possible left great toenail partial versus total excision, and     ablation with phenol in the  future. 5. No need for surgical intervention at this time.     [name], P.A.   ______________________________ Leonides Grills, M.D.    GC/MEDQ  D:  06/01/2010  T:  06/01/2010  Job:  [id number]  Electronically Signed by [name] P.A. on 06/06/2010 09:24:45 AM Electronically Signed by Leonides Grills M.D. on 06/09/2010 03:44:26 PM

## 2010-06-14 NOTE — Progress Notes (Signed)
       Additional Follow-up for Phone Call Additional follow up Details #2::    pt returning call [phone] Follow-up by: Glynda Jaeger,  November 17, 2009 4:08 PM

## 2010-06-14 NOTE — Miscellaneous (Signed)
  Clinical Lists Changes  Problems: Added new problem of ATRIAL FIBRILLATION, PAROXYSMAL (ICD-427.31) Added new problem of MITRAL REGURGITATION (ICD-396.3) Added new problem of * IVC FILTER Added new problem of * LEFT FOOT.Marland Kitchen CONGENITAL ABNORMALITY... 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 improved Hx of ADULT RESPIRATORY DISTRESS SYNDROME (ICD-518.82) Hx of ACUTE APPENDICITIS WITH GENERALIZED PERITONITIS (ICD-540.0) DEPRESSION / ANXIETY (ICD-300.4) BACK PAIN, CHRONIC (ICD-724.5)  Atrial fibrillation   paroxysmal  (at the time of his severe illness) EF  normal.... echo.... 2005 Diastolic dysfunction   by history Mitral regurgitation   mild by history Tricuspid regurgitation   mild by history IVC filter   2000  ( at the time of his severe illness - DVT and DIC at that time Left foot   congenital abnormality... 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 2000 Nuclear.... 2003.... no ischemia  (01/11/2010 10:44) Added new observation of PRIMARY MD: Dondra Spry DO (01/11/2010 10:44)       Past History:  Past Medical History: UCD HYPOTHYROIDISM (ICD-244.9) Hx of CVA WITH LEFT HEMIPARESIS (ICD-438.20)  at the time of critical illness in the past... improved Hx of ADULT RESPIRATORY DISTRESS SYNDROME (ICD-518.82) Hx of ACUTE APPENDICITIS WITH GENERALIZED PERITONITIS (ICD-540.0) DEPRESSION / ANXIETY (ICD-300.4) BACK PAIN, CHRONIC (ICD-724.5)  Atrial fibrillation   paroxysmal  (at the time of his severe illness) EF  normal.... echo.... 2005 Diastolic dysfunction   by history Mitral regurgitation   mild by history Tricuspid regurgitation   mild by history IVC filter   2000  ( at the time of his severe illness - DVT and DIC at that time Left foot   congenital abnormality... 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 2000 Nuclear.... 2003.... no ischemia

## 2010-06-14 NOTE — Assessment & Plan Note (Signed)
Summary: rov per pt cal/lg   Visit Type:  Follow-up Primary [name]:  [facility]  CC:  atrial fibrillation.  History of Present Illness: The patient is seen for followup nature fibrillation.  She had a significant illness with appendicitis and peritonitis in 2000.  She had an IVC filter placed at that time for DVT and DIC.  Fortunately she recovered over time.  She had some atrial fibrillation around the time of her severe illness.  She has not had return of this.  There is mild mitral regurgitation by history.  There was no ischemia by nuclear scan in 2003.  Ejection fraction was normal in 2005.  She's feeling well.  She did not have any chest pain or palpitations.  Current Medications (verified): 1)  Klonopin 1 Mg Tabs (Clonazepam) .... Take 1 Tablet By Mouth Four Times A Day 2)  Lamotrigine 150 Mg Tabs (Lamotrigine) .... Take 1 Tab By Mouth At Bedtime 3)  Synthroid 75 Mcg Tabs (Levothyroxine Sodium) .... Take 1 Tablet By Mouth Once A Day 4)  Alendronate Sodium 70 Mg Tabs (Alendronate Sodium) .... Take 1 Tablet By Mouth Once A Week( Office Visit Needed) 5)  Zolpidem Tartrate 5 Mg Tabs (Zolpidem Tartrate) .Marland Kitchen.. 1 By Mouth At Bedtime 6)  Remeron 15 Mg Tabs (Mirtazapine) .... Take 1 Tab By Mouth At Bedtime 7)  Celebrex 200 Mg Caps (Celecoxib) .... Once Daily  Allergies (verified): No Known Drug Allergies  Past History:  Past Medical History: UCD HYPOTHYROIDISM (ICD-244.9) Hx of CVA WITH LEFT HEMIPARESIS (ICD-438.20)  at the time of critical illness in the past... improved Hx of ADULT RESPIRATORY DISTRESS SYNDROME (ICD-518.82) Hx of ACUTE APPENDICITIS WITH GENERALIZED PERITONITIS (ICD-540.0)....2000 DEPRESSION / ANXIETY (ICD-300.4) BACK PAIN, CHRONIC (ICD-724.5)  Atrial fibrillation   paroxysmal  (at the time of his severe illness) EF  normal.... echo.... 2005  /  EF 60%... echo... April 22, 2007 Diastolic dysfunction   by history.. Tricuspid regurgitation   mild by  history Aortic insufficiency  mild... echo.. December, 2008 IVC filter   2000  ( at the time of his severe illness - DVT and DIC at that time Left foot   congenital abnormality... 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 2000 Nuclear.... 2003.... no ischemia  Review of Systems       Patient denies fever, chills, headache, sweats, rash, change in vision, change in hearing, chest pain, cough, nausea vomiting, urinary symptoms.  All other systems are reviewed and are negative.  Vital Signs:  Patient profile:   70 year old female Height:      65 inches Weight:      126 pounds BMI:     21.04 Pulse rate:   75 / minute BP sitting:   112 / 70  (left arm) Cuff size:   regular  Vitals Entered By: Hardin Negus, RMA (March 23, 2010 10:21 AM)  Physical Exam  General:  patient is stable. Eyes:  no xanthelasma. Neck:  no jugular venous distention. Lungs:  lungs are clear.  Respiratory effort is nonlabored. Heart:  cardiac exam reveals S1-S2.  No clicks or significant murmurs. Abdomen:  abdomen is soft. Extremities:  no peripheral edema. Psych:  patient is oriented to person time and place.  Affect is normal.   Impression & Recommendations:  Problem # 1:  MITRAL REGURGITATION (ICD-396.3) This was mild in the past.  I will re\re reviewing her last echo was done. I have now found the echo  done in 2008.  Ejection fraction was 60-65%.  There was mild diastolic dysfunction.  There was mild AI.  There was trivial MR.  No further workup at this time.  Problem # 2:  ATRIAL FIBRILLATION, PAROXYSMAL (ICD-427.31)  Orders: EKG w/ Interpretation (93000) EKG is done today and reviewed by me.  There is normal sinus rhythm.  There is no evidence of return of atrial fibrillation.  No further workup needed.  Problem # 3:  * IVC FILTER This was placed in the past when she was extremely ill.  No further workup.  Followup in one year.  Patient  Instructions: 1)  Your physician recommends that you schedule a follow-up appointment in: 1 year. 2)  Your physician recommends that you continue on your current medications as directed. Please refer to the Current Medication list given to you today.

## 2010-06-14 NOTE — Letter (Signed)
Summary: [facility] Referral (unable to schedule) Notification  [facility]  [address] [place], [place] [address]   Phone: [phone]  Fax: [phone]      November 10, 2009 [name] [birth year] MRN: [id number]   [facility] [address] [place], [place]   [address]   Dear Dr. Milus Glazier:   Thank you for your kind referral of the above patient. We have attempted to schedule the recommended Colonoscopy but have been unable to schedule because:  _x_ The patient was not available by phone and/or has not returned our calls.  __ The patient declined to schedule the procedure at this time.  We appreciate the referral and hope that we will have the opportunity to treat this patient in the future.    Sincerely,   [facility]  Dana Rea. Jarold Motto M.D. Dana Morton. Juanda Chance M.D. Dana Lick. Russella Dar M.D. Dana Bonito. Marina Goodell M.D. Dana Hair. Arlyce Dice M.D. Dana Boop M.D. Dana Every.D.

## 2010-06-14 NOTE — Progress Notes (Signed)
Summary: sob  Phone Note Call from Patient Call back at Home Phone [phone]   Caller: Patient Summary of Call: Pt having sob Initial call taken by: Judie Grieve,  November 17, 2009 12:08 PM  Follow-up for Phone Call        Left message to call back Meredith Staggers, RN  November 17, 2009 2:12 PM  pt returned call [phone] Follow-up by: Glynda Jaeger,  November 17, 2009 2:52 PM  Additional Follow-up for Phone Call Additional follow up Details #1::        pt feels her breathing is more labored at times, today she stated in and hasn't been out and can tell a huge different, advised heat and humidy can cause issues she will try to stay in as much as possible during heat of day Meredith Staggers, RN  November 17, 2009 4:10 PM

## 2010-06-14 NOTE — Progress Notes (Signed)
Summary: sob  Phone Note Call from Patient Call back at Home Phone [phone]   Caller: Patient Reason for Call: Talk to Nurse Summary of Call: per pt calling, c/o some sob. cold. pcp was not contacted.  Initial call taken by: Lorne Skeens,  November 08, 2009 12:39 PM  Follow-up for Phone Call        Left message to call back Meredith Staggers, RN  November 08, 2009 3:22 PM  pt states she was out in the sun today and began feeling bad, sob and fatigued, now she feels just fine, she believes it was just from the heat advised she should avoid going out in midday if possible try to go out early or later in pm she is agreeable and will monitor symptoms. Follow-up by: Meredith Staggers, RN,  November 08, 2009 4:00 PM

## 2010-06-14 NOTE — Miscellaneous (Signed)
  Clinical Lists Changes  Problems: Added new problem of MITRAL REGURGITATION (ICD-396.3) Observations: Added new observation of PAST MED HX: UCD HYPOTHYROIDISM (ICD-244.9) Hx of CVA WITH LEFT HEMIPARESIS (ICD-438.20)  at the time of critical illness in the past... improved Hx of ADULT RESPIRATORY DISTRESS SYNDROME (ICD-518.82) Hx of ACUTE APPENDICITIS WITH GENERALIZED PERITONITIS (ICD-540.0)....2000 DEPRESSION / ANXIETY (ICD-300.4) BACK PAIN, CHRONIC (ICD-724.5)  Atrial fibrillation   paroxysmal  (at the time of his severe illness) EF  normal.... echo.... 2005 Diastolic dysfunction   by history Mitral regurgitation   mild by history Tricuspid regurgitation   mild by history IVC filter   2000  ( at the time of his severe illness - DVT and DIC at that time Left foot   congenital abnormality... 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 2000 Nuclear.... 2003.... no ischemia  (03/22/2010 19:35) Added new observation of PRIMARY MD: Dondra Spry DO (03/22/2010 19:35)       Past History:  Past Medical History: UCD HYPOTHYROIDISM (ICD-244.9) Hx of CVA WITH LEFT HEMIPARESIS (ICD-438.20)  at the time of critical illness in the past... improved Hx of ADULT RESPIRATORY DISTRESS SYNDROME (ICD-518.82) Hx of ACUTE APPENDICITIS WITH GENERALIZED PERITONITIS (ICD-540.0)....2000 DEPRESSION / ANXIETY (ICD-300.4) BACK PAIN, CHRONIC (ICD-724.5)  Atrial fibrillation   paroxysmal  (at the time of his severe illness) EF  normal.... echo.... 2005 Diastolic dysfunction   by history Mitral regurgitation   mild by history Tricuspid regurgitation   mild by history IVC filter   2000  ( at the time of his severe illness - DVT and DIC at that time Left foot   congenital abnormality... 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 2000 Nuclear.... 2003.... no ischemia

## 2010-06-14 NOTE — Miscellaneous (Signed)
  Clinical Lists Changes  Observations: Added new observation of PRIMARY MD: Elvina Sidle, MD (03/23/2010 13:14)

## 2010-06-22 NOTE — H&P (Signed)
[name], [name] NO.:  [id number]  MEDICAL RECORD NO.:  [id number]           PATIENT TYPE:  LOCATION:                                 FACILITY:  PHYSICIAN:  Richarda Overlie, MD       DATE OF BIRTH:  [birth year]  DATE OF ADMISSION:  05/31/2010 DATE OF DISCHARGE:                             HISTORY & PHYSICAL   PRIMARY CARE PHYSICIAN:  The patient currently does not have a primary care provider.  PRIMARY CARDIOLOGIST:  Luis Abed, MD, [facility]  PRIMARY GASTROENTEROLOGY PHYSICIAN:  Dr. Laurence Ferrari at [facility] practice.  SUBJECTIVE:  This is a 70 year old female who presented to [facility] because of redness, erythema, warmth in her left foot which was fairly acute in onset.  The patient states that she has a corn at the base of her left great toe which peeled off while she was in the shower 3 days ago.  The corn has since then reepithelialized, but this afternoon, the patient noticed that she had redness around it, starting from the base of the first toe all the way down into the dorsal aspect of her ankle, and she had hyperalgesia of the skin and difficulty with ambulation and weightbearing.  She presented to [facility], and an x-ray confirmed that she had cellulitis of the medial aspect of the left first toe.  The patient also has a screw traversing the first metatarsal and the medial cuneiform and has lucency along its periphery that may represent potential loosening or potential infection.  The patient was empirically started on vancomycin and Zosyn and was subsequently transferred to [facility] for further orthopedic evaluation, and Dr. Venita Lick has been notified.  She was found to have a low-grade fever at [facility].  PAST MEDICAL HISTORY: 1. Hypothyroidism. 2. History of CVA with left-sided hemiparesis at the time of critical     illness. 3. History ARDS. 4. History of acute appendicitis with  generalized peritonitis. 5. Depression. 6. Anxiety. 7. Chronic back pain. 8. Paroxysmal atrial fibrillation, now resolved, normal EF with last     echo in 2005. 9. Diastolic dysfunction which is chronic. 10.Tricuspid regurgitation. 11.Aortic insufficiency. 12.IVC filter in 2000. 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 14.Nuclear stress test in 2003 that was negative.  PAST SURGICAL HISTORY: 1. Appendectomy. 2. Breast biopsy. 3. Reconstructive foot surgery in 2001. 4. Bladder suspension surgery. 5. IVC filter.  FAMILY HISTORY:  Father died of coronary artery disease and diabetes. Mother deceased at age 65 from coronary artery disease.  SOCIAL HISTORY:  The patient used to live in [place] recently and moved to [place] to live with his son.  She was married for 12 years, recently divorced.  She has two son and one daughter and two grandchildren, and she lives in an assisted living.  SOCIAL HISTORY:  She quit smoking in the 1970s.  She drinks coffee twice a day.  Exercises regularly four times a week.  Last colonoscopy was on  November 26, 2007, that showed diverticulosis.  Last Pap smear was on January 26, 2004.  Last mammogram was on January 26, 2004.  REVIEW OF SYSTEMS:  Complete review of systems was done as documented in HPI.  ALLERGIES:  No known drug allergies.  HOME MEDICATIONS:  Synthroid, Klonopin, Ambien, Remeron, and Lamictal.  PHYSICAL EXAMINATION:  VITAL SIGNS:  Blood pressure 118/60, pulse rate 83, respirations 16, temperature 97.6. GENERAL:  Currently, anxious, comfortable, in no acute cardiopulmonary distress. HEENT:  Pupils equal and reactive.  Extraocular movements intact. NECK:  Supple.  No JVD. LUNGS:  Clear to auscultation bilaterally.  No wheezes or crackles or rhonchi. ABDOMEN:  Soft, nontender, nondistended. EXTREMITIES:  Cellulitis of the left great toe on the left  forefoot extending up the medial side of her foot towards the left ankle.  Her foot is mildly tender to palpation.  She has intact range of motion of the ankle but not of the left first metatarsophalangeal joint. NEUROLOGIC:  Cranial nerves II through XII grossly intact. SKIN:  Erythematous as described above. PSYCHIATRIC:  Anxious. LYMPHATIC:  No axillary, inguinal, or cervical lymphadenopathy.  LABORATORY DATA:  EKG shows normal sinus rhythm. X-ray of the left foot shows cellulitis of the medial aspect of the left first toe, prior surgery with stable screws and plates in place with screw traversing the first metatarsal, and medial cuneiform has lucency along its periphery that may represent loosening or potentially infection. Mild left basilar scarring on the chest x-ray. Sodium 147, potassium 4.1, chloride 109, bicarb 26, glucose 101, BUN 18, creatinine 8, calcium 9.5, total protein 6.9, albumin 4.1, AST 21, ALT 23, alk phos 74, total bili 0.5.  WBC 10.2, hemoglobin 12.7, hematocrit 38.2, platelet count of 289.  Lactic acid of 0.9.  ASSESSMENT: 1. Cellulitis of the left foot. 2. History of paroxysmal atrial fibrillation. 3. Anxiety. 4. Hypothyroidism. 5. Status post inferior vena cava filter.  PLAN:  The patient will be evaluated by Dr. Venita Lick who has been notified.  The patient has had extensive surgery on her foot with potential loosening.  I am not quite sure that the patient is able to have an MRI or not.  Depending on the kind of hardware she has, she may not be a candidate for an MRI of the foot.  We will defer further imaging studies to Dr. Shon Baton.  We will start her on vancomycin and Zosyn and obtain blood cultures.  We will also check a uric acid and an ESR.  The patient has requested that if she needs any kind of surgery, then Dr. Audree Bane in [place] should be notified that the patient is in the hospital and Dr. Venita Lick and Dr. Audree Bane should be  in communication with each other to decide what kind of surgery the patient needs.  This will be passed onto Dr. Shon Baton.  The patient currently is not on any anticoagulation or aspirin or Plavix for her history of paroxysmal atrial fibrillation.  She will be placed on Lovenox for DVT prophylaxis.  We will keep her n.p.o. past midnight.  She is a full code.     Richarda Overlie, MD     NA/MEDQ  D:  05/31/2010  T:  05/31/2010  Job:  [id number]  Electronically Signed by Richarda Overlie MD on 06/22/2010 07:49:29 PM

## 2010-06-24 ENCOUNTER — Emergency Department (HOSPITAL_BASED_OUTPATIENT_CLINIC_OR_DEPARTMENT_OTHER)
Admission: EM | Admit: 2010-06-24 | Discharge: 2010-06-25 | Disposition: A | Discharge status: Home / Self Care | Payer: Medicare Other | Attending: Emergency Medicine | Admitting: Emergency Medicine

## 2010-06-24 DIAGNOSIS — K5289 Other specified noninfective gastroenteritis and colitis: Secondary | ICD-10-CM | POA: Insufficient documentation

## 2010-06-24 DIAGNOSIS — R109 Unspecified abdominal pain: Secondary | ICD-10-CM | POA: Insufficient documentation

## 2010-06-24 DIAGNOSIS — I4891 Unspecified atrial fibrillation: Secondary | ICD-10-CM | POA: Insufficient documentation

## 2010-06-24 DIAGNOSIS — Z79899 Other long term (current) drug therapy: Secondary | ICD-10-CM | POA: Insufficient documentation

## 2010-06-24 DIAGNOSIS — Z8679 Personal history of other diseases of the circulatory system: Secondary | ICD-10-CM | POA: Insufficient documentation

## 2010-06-24 DIAGNOSIS — F319 Bipolar disorder, unspecified: Secondary | ICD-10-CM | POA: Insufficient documentation

## 2010-06-24 LAB — URINALYSIS, ROUTINE W REFLEX MICROSCOPIC
Bilirubin Urine: NEGATIVE
Ketones, ur: NEGATIVE mg/dL
Nitrite: NEGATIVE
Protein, ur: NEGATIVE mg/dL
Urobilinogen, UA: 0.2 mg/dL (ref 0.0–1.0)
pH: 7 (ref 5.0–8.0)

## 2010-06-24 LAB — URINE MICROSCOPIC-ADD ON

## 2010-06-25 ENCOUNTER — Emergency Department (INDEPENDENT_AMBULATORY_CARE_PROVIDER_SITE_OTHER): Payer: Medicare Other

## 2010-06-25 DIAGNOSIS — R112 Nausea with vomiting, unspecified: Secondary | ICD-10-CM

## 2010-06-25 DIAGNOSIS — J449 Chronic obstructive pulmonary disease, unspecified: Secondary | ICD-10-CM

## 2010-06-25 DIAGNOSIS — R109 Unspecified abdominal pain: Secondary | ICD-10-CM

## 2010-06-25 DIAGNOSIS — K5900 Constipation, unspecified: Secondary | ICD-10-CM

## 2010-06-25 LAB — DIFFERENTIAL
Eosinophils Relative: 2 % (ref 0–5)
Lymphocytes Relative: 36 % (ref 12–46)
Lymphs Abs: 2.8 10*3/uL (ref 0.7–4.0)
Monocytes Absolute: 0.7 10*3/uL (ref 0.1–1.0)
Monocytes Relative: 9 % (ref 3–12)
Neutro Abs: 4.1 10*3/uL (ref 1.7–7.7)

## 2010-06-25 LAB — COMPREHENSIVE METABOLIC PANEL
ALT: 27 U/L (ref 0–35)
BUN: 12 mg/dL (ref 6–23)
CO2: 27 mEq/L (ref 19–32)
Calcium: 8.6 mg/dL (ref 8.4–10.5)
GFR calc non Af Amer: >60 mL/min (ref >60)
Glucose, Bld: 99 mg/dL (ref 70–99)
Sodium: 136 mEq/L (ref 135–145)
Total Protein: 6.4 g/dL (ref 6.0–8.3)

## 2010-06-25 LAB — CBC
HCT: 34.7 % — ABNORMAL LOW (ref 36.0–46.0)
Hemoglobin: 12.1 g/dL (ref 12.0–15.0)
MCH: 29.7 pg (ref 26.0–34.0)
MCHC: 34.9 g/dL (ref 30.0–36.0)
MCV: 85 fL (ref 78.0–100.0)
RDW: 12.1 % (ref 11.5–15.5)

## 2010-07-30 LAB — URINALYSIS, ROUTINE W REFLEX MICROSCOPIC
Glucose, UA: NEGATIVE mg/dL
Ketones, ur: NEGATIVE mg/dL
Nitrite: NEGATIVE
Protein, ur: NEGATIVE mg/dL
pH: 5.5 (ref 5.0–8.0)

## 2010-07-30 LAB — WET PREP, GENITAL
Trich, Wet Prep: NONE SEEN
Yeast Wet Prep HPF POC: NONE SEEN

## 2010-07-30 LAB — URINE CULTURE

## 2010-07-30 LAB — URINE MICROSCOPIC-ADD ON

## 2010-08-14 LAB — URINE MICROSCOPIC-ADD ON

## 2010-08-14 LAB — URINALYSIS, ROUTINE W REFLEX MICROSCOPIC
Glucose, UA: NEGATIVE mg/dL
Ketones, ur: NEGATIVE mg/dL
Protein, ur: 30 mg/dL — AB
Urobilinogen, UA: 0.2 mg/dL (ref 0.0–1.0)

## 2010-08-24 LAB — CBC
MCHC: 34 g/dL (ref 30.0–36.0)
MCV: 92.4 fL (ref 78.0–100.0)
RBC: 4.1 MIL/uL (ref 3.87–5.11)
RDW: 12.3 % (ref 11.5–15.5)

## 2010-08-24 LAB — URINALYSIS, ROUTINE W REFLEX MICROSCOPIC
Hgb urine dipstick: NEGATIVE
Nitrite: NEGATIVE
Protein, ur: NEGATIVE mg/dL
Specific Gravity, Urine: 1.004 — ABNORMAL LOW (ref 1.005–1.030)
Urobilinogen, UA: 0.2 mg/dL (ref 0.0–1.0)

## 2010-08-24 LAB — BASIC METABOLIC PANEL
CO2: 28 mEq/L (ref 19–32)
Calcium: 9.2 mg/dL (ref 8.4–10.5)
Chloride: 108 mEq/L (ref 96–112)
Creatinine, Ser: 0.8 mg/dL (ref 0.4–1.2)
GFR calc Af Amer: >60 mL/min (ref >60)
Glucose, Bld: 89 mg/dL (ref 70–99)

## 2010-08-24 LAB — DIFFERENTIAL
Basophils Absolute: 0.1 10*3/uL (ref 0.0–0.1)
Basophils Relative: 1 % (ref 0–1)
Eosinophils Absolute: 0.1 10*3/uL (ref 0.0–0.7)
Monocytes Absolute: 0.5 10*3/uL (ref 0.1–1.0)
Monocytes Relative: 7 % (ref 3–12)
Neutrophils Relative %: 55 % (ref 43–77)

## 2010-08-24 LAB — URINE MICROSCOPIC-ADD ON

## 2010-08-24 LAB — HEMOCCULT GUIAC POC 1CARD (OFFICE): Fecal Occult Bld: NEGATIVE

## 2010-09-26 NOTE — Assessment & Plan Note (Signed)
[facility]ALTHCARE                            CARDIOLOGY OFFICE NOTE   TORRIE, NAMBA                            MRN:          [id number]  DATE:01/16/2007                            DOB:          [birth year]    Miss Liberman to this area from [place], [place] in approximately the  past six months. She has a complicated medical history. Fortunately, we  have obtained records and I have reviewed them using a significant  amount of time to do so. She had a complicated prior history. I have  also had a very long discussion with the patient to fully understand her  status. At this time, her cardiac status is stable. She is not having  any chest pain or shortness of breath.   There is a history of the patient having some atrial fibrillation in the  past. Also, she had an illness in 2000, when she was critically ill. At  that time, there was question of the possibility of endocarditis, but  this was never proven.   At this time, she is here and she is here for cardiology followup. With  her illness in the past, she had a gangrenous appendix and she had  surgery and had DIC. This led to a left body CVA. She had clots in her  toe and had a partial amputation of toes on her left foot. TEE was done  on two occasions. Ultimately, it was felt that she had some mitral valve  thickening. Because of the fact that she had DIC and she remained  critically ill, she required an IVC filter when she had deep venous  thrombosis. Fortunately, this all stabilized over time.   The patient's left body improved and the findings of her CVA improved.  However, she has significant difficulty with a congenital problem in her  left foot. She then had multiple operations and this is now somewhat  improved.   PAST MEDICAL HISTORY:   ALLERGIES:  No known drug allergies.   MEDICATIONS:  1. Synthroid 75 mcg.  2. Wellbutrin 100 b.i.d.  3. Lamictal 100 daily.  4. Klonopin 1 mg t.i.d.  5. Fosamax 70 once a week.   OTHER MEDICAL PROBLEMS:  See the complete list below.   SOCIAL HISTORY:  The patient is divorced and is not working at this  time. She stopped smoking 25 years ago.   FAMILY HISTORY:  There is no strong family history of coronary disease  at a young age.   REVIEW OF SYSTEMS:  The patient has some lower back pain. She has  thyroid disease that is treated. She has anxiety and depression that are  treated. She also has a problem with fatigue. There is a small area on  the top of the big toe on her left foot that is not 100% healed. This  will need further evaluation and she will be seeing an orthopedic  surgeon in [facility]. Otherwise, her review of systems is negative.   PHYSICAL EXAMINATION:  Weight is 130 pounds. Blood pressure 116/71.  Heart rate is  85. The patient is oriented to person, time and place.  Affect is normal.  HEENT:  He has normal extraocular motion. There are no carotid bruits.  There is no jugular venous distention. The patient's skin color is very  fair. There is no xanthelasma.  LUNGS:  Are clear. Respiratory effort is not labored.  CARDIAC: Reveals an S1, with an S2. There is a 2/6 systolic murmur.  ABDOMEN: Soft. There are no masses or bruits. She has normal bowel  sounds.  There is no peripheral edema. The patient's left foot is partially fused  with the operations that she has. There is a very small area on the top  of her great toe of her right foot that almost appears as if there is a  small very small growth on it. There is no redness or heat. She has no  significant peripheral edema. The patient does walk with a cane. Her  muscle strength in her left arm is good and it is good in her left leg,  but she walks with a cane and has the multiple surgeries to her left  foot.   EKG: Reveals sinus rhythm. One PVC is noted.   PROBLEM LIST:  1. Hypothyroidism, on medication.  2. Congenital foot deformity with surgery to her foot  and with      multiple surgeries. This is greatly improved for her, but there is      a slight abnormality on the top of the right great toe and this is      to be assessed by orthopedic surgery.  3. History of a cerebral vascular accident in 2000 associated with      DIC. She has improved greatly from this.  4. History of partial toe removals in her right foot related to clots      from the DIC in 2000.  5. History of diverticulosis.  6. History of an IVC filter placed in 2000 when she had bleeding      related to the IVC at the same time that she had deep venous      thrombosis. She has not been on Coumadin in recent years.  7. Episode of paroxysmal atrial fibrillation associated with her      severe illness in 2000 and this resolved.  8. Good systolic left ventricular function by echo last done in 2005.  9. History of trace aortic regurgitation.  10.Trace mitral regurgitation and tricuspid regurgitation.  11.Question of mild diastolic dysfunction by echo in 2005. She does      need a followup echo at this time.  12.Episode of thallium scan in 2003, revealing no significant      ischemia.  13.History of anxiety and depression and the patient is on medications      for this.  14.History of bladder prolapse.  15.Status post gangrenous appendix in 2000 with her critical illness      and this was operated on.   At this point, I am recommending that she have a followup 2D echo to  reassess her LV function and her valvular function and then I will see  her for followup. I made is clear I will be happy to help her in any way  that I can with her medical care.     Luis Abed, MD, [facility]  Electronically Signed    JDK/MedQ  DD: 01/16/2007  DT: 01/16/2007  Job #: [id number]   cc:   Drucie Opitz

## 2010-09-26 NOTE — H&P (Signed)
NAME:  [name], [name] NO.:  [id number]   MEDICAL RECORD NO.:  [id number]          PATIENT TYPE:  IPS   LOCATION:  0307                          FACILITY:  [facility]   PHYSICIAN:  Geoffery Lyons, M.D.      DATE OF BIRTH:  1941-03-13   DATE OF ADMISSION:  12/10/2007  DATE OF DISCHARGE:                       PSYCHIATRIC ADMISSION ASSESSMENT   IDENTIFICATION:  This is a 70 year old Caucasian female.  This is a  voluntary admission.   HISTORY OF PRESENT ILLNESS:  First [facility] admission for this pleasant woman  who was referred by her psychiatrist after she expressed thoughts that  she did not want to live anymore.  Today she says that she is tired of  living, does not have a lot of motivation to go on with life and, the  same time,  denies that she has any desire to actually take action to  kill herself.  She endorses at least 2 weeks of low energy, depressed  mood, being more irritable and hopeless.  Endorses being rather lonely  and socially isolated in her one bedroom condominium at a local  retirement center.  She says that she has had some issues with the  depression since moving down here approximately 4 years ago from [place], [place] is her home.   Her trigger for her current feelings of depression are learning from her  son that her daughter, who lived near her in [place], had asked her  son to take her down to [place] to help care for her.  Upon  learning this, her feelings were hurt.  She feels alienated and  abandoned by her children.  She says here in [place], she has not  seen her grandchildren for 6 months up until last week when she spent  some time with her son at their home.  She feels that she cannot  approach her two sons that live in the area or their wives because they  live busy lives and do not have time for her.   She is a retired Runner, broadcasting/film/video and has also not pursued much in the way of  other social contact.  She denies homicidal  thoughts today, says that  she did not intend to imply that she was suicidal but had wanted to talk  to Zenia Resides, her psychotherapist about these stressors.   PAST PSYCHIATRIC HISTORY:  First [facility] admission.  She has a history of  one prior admission at [facility] and is currently  followed by Dr. Jules Schick as an outpatient and by Zenia Resides, RN, her  counselor.  She does not have a history of prior suicide attempts.   She has a history of prior CVA with acute renal failure accompanied by  DIC, required, at one point has been on a ventilator in the past, has  tracheostomy scars noted.  She says that her depression began after this  illness and that she endorses that she has had difficulty living alone  since that time.   Denies any history of substance abuse.   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  She is currently living in the independent living section of a local  retirement center.  She has one daughter that lives in [place], two  sons and their families that live in [place], has two grandchildren,  ages 64 and one years of age.   No legal problems, is financially stable.   She is previously divorced.   FAMILY HISTORY:  She endorses her son with a history of substance abuse.   MEDICAL HISTORY:  Followed by Dr. Debby Bud.  Medical problems are post CVA  in 2000 with acute renal failure.  Past medical history is significant  for left foot reconstructive surgery for congenital deformity.  She  ambulates with a cane.  Please note she has current diagnosis of atrial  fibrillation.  She is post CVA with left-sided weakness, also has a past  history of peritonitis.   CURRENT MEDICATIONS:  1. Klonopin 1 mg one to one and a half tablets four times a day.  2. Celebrex 200 mg either once a day or twice daily which is not      clear.  3. Ambien 10 mg at  bedtime.  4. Lamictal 150 mg p.o. at bedtime.  5. Synthroid 75 mcg daily.  6. Wellbutrin SR 100 mg b.i.d.  7. Fosamax 70 mg one time weekly on Saturday.   DRUG ALLERGIES:  None.   PHYSICAL EXAMINATION:  Positive physical findings:  Physical exam was  done in the emergency room as noted there.  She was noted to be at what  appears to be her baseline, ambulates with a steady gait.  Does have  some left-sided weakness and she ambulates with a cane.  Gait is safe.  Today she is fully alert, afebrile, in no acute distress, articulate  with a sense of humor, 5 feet 5 inches tall, 129 pounds, temperature  97.3, pulse 82, respirations 16, blood pressure 103/72.   DIAGNOSTIC STUDIES:  CBC:  WBC 6.8, hemoglobin 13.8, hematocrit 41.1,  platelets 315,000.  Chemistry panel:  Sodium 138, potassium 4.0,  chloride 108, carbon dioxide is 19, BUN 12, creatinine 1.4, random  glucose 103.  Alcohol level less than 5.  Urine drug screen positive for  benzodiazepines, negative for all other substances.   MENTAL STATUS EXAM:  Fully alert female, pleasant cooperative, engaged  in conversation, polite, does demonstrate a little bit of short and long-  term memory loss, having difficulty, for example, remembering the exact  name of her retirement center, although she can describe it and its  location.  She knows date and place and understands why she is in the  hospital.  Admits to being depressed, talks quite a bit about family  dynamics and her loneliness, having moved down here from [place].   Speech is normal in pace, tone and form.  Her affect is depressed but  she does appreciate humor and has a sense of humor.  Thought processes  logical and coherent.  Her insight is adequate.  Denying any suicidal  intent today, but admitting to a considerable depression and  hopelessness exacerbated by her loneliness.   Cognitively she is intact and oriented x3.  No evidence of psychosis.  Does have some memory  lapses, searches memory carefully for certain  words.  She is very clear about her list of medications and generally  recalls all important details.  AXIS I:     Depressive disorder, NOS.  AXIS II:    Deferred.  AXIS III:   History of left foot reconstruction, post CVA with mild left-  sided weakness, hypothyroidism.  AXIS IV:    Moderate to severe chronic issues with social isolation and  family relationship issues.  AXIS V:     Current 46, past year estimated at 33.   PLAN:  Voluntarily admit her to evaluate her mood and hopefully  alleviate some of her depression.  We are going to continue her current  medications at this point and have made contact with Zenia Resides, her  counselor, to see if she  could see her today.  So far we have not heard back from her.  Will plan  on scheduling that followup with her.  So far she has not agreed to  allow [place] to contact her family.  We have asked her to consider having a  family session.  Estimated length of stay is 5 days.      Margaret A. Scott, N.P.      Geoffery Lyons, M.D.  Electronically Signed    MAS/MEDQ  D:  12/11/2007  T:  12/11/2007  Job:  [id number]

## 2010-09-26 NOTE — Assessment & Plan Note (Signed)
[name]CARE                            CARDIOLOGY OFFICE NOTE   [name], [name]                            MRN:          [id number]  DATE:06/05/2007                            DOB:          21-Feb-1941    HISTORY OF PRESENT ILLNESS:  [name] is seen for cardiology follow-up.  I saw her in September 2008.  She has moved from [place], [place].  There is an extensive history.  She had some atrial fibrillation in the  past.  This was associated with a severe illness.  Ultimately, she  stabilized as outlined in my note of January 16, 2007, and she has done  well since then.  She is not having any chest pain.  She has continued  problems with a congenital foot deformity.  She also had some partial  toe removals from DIC in 2000.   There is no documented coronary disease.   PAST MEDICAL HISTORY:   ALLERGIES:  NO KNOWN DRUG ALLERGIES.   MEDICATIONS:  Synthroid, Wellbutrin, Klonopin, Fosamax and Lamictal.   OTHER MEDICAL PROBLEMS:  See the list on my note of January 16, 2007.   REVIEW OF SYSTEMS:  She is doing well overall.  She would like to find a  primary care physician and we will contact Dr. Debby Bud to help with this.  Also, it is time to check a TSH.  Otherwise, review of systems is  negative.   PHYSICAL EXAMINATION:  VITAL SIGNS:  Blood pressure is 119/73.  Pulse is  77.  The patient is oriented to person, time and place.  Affect is  normal.  HEENT:  Reveals no xanthelasma.  She has normal extraocular motion.  There are no carotid bruits.  There is no jugular venous distention.  LUNGS:  Clear.  Respiratory effort is not labored.  CARDIAC:  Reveals S1-S2.  There are no clicks or significant murmurs.  ABDOMEN:  Soft.  The patient does have a congenital deformity of her  right foot.   A 2-D echo was done on April 22, 2007.  She has normal LV function.  There is mild aortic insufficiency.  There is mild mitral annular  calcification.   Otherwise, the echo was normal.   I had a nice discussion with the patient today about multiple issues.  She is stable.  Problems are listed previously.  #1.  Hypothyroidism.  It is time to check her TSH.  #8.  History of a good systolic function in the past and on her recent  echo.  #7.  Paroxysmal atrial fib.  This resolved with her acute illness and  she is in regular rhythm at this time.   Overall, she is stable.  I can see her back for cardiology follow-up in  one year.     Luis Abed, MD, [facility]  Electronically Signed    JDK/MedQ  DD: 06/05/2007  DT: 06/05/2007  Job #: [id number]

## 2010-09-26 NOTE — Assessment & Plan Note (Signed)
[facility]ALTHCARE                            CARDIOLOGY OFFICE NOTE   ALABAMA, DOIG                            MRN:          [id number]  DATE:07/09/2008                            DOB:          13-Jul-1940    [name] is doing well from the cardiac viewpoint.  She has some  problems with some ongoing depression.  She is not from this area and is  not particularly happy at the area where she is living.  However, she is  stable.  She is not having any significant chest pains.  We know she has  good LV function by history.  She had had an IVC filter placed in 2000  when she had a complicated medical situation which is resolved.  She had  atrial fib with a severe illness at that time and all these issues are  resolved.  There is some mild diastolic dysfunction and mild MR and TR  historically.  There has been no proven ischemia over time.   PAST MEDICAL HISTORY:   ALLERGIES:  No known drug allergies.   MEDICATIONS:  See the flow sheet.   REVIEW OF SYSTEMS:  She has mild ongoing discomfort from her foot.  This  is a chronic problem.  She is not having any GI or GU symptoms.  There  are no fevers or chills.  There are no skin rashes.  She has no  headaches.  Otherwise, review of systems is negative.   PHYSICAL EXAMINATION:  VITAL SIGNS:  Blood pressure 100/60 with a pulse  of 85.  GENERAL:  The patient is oriented to person, time, and place.  Affect is  normal.  HEENT:  No xanthelasma.  She has normal extraocular motion.  NECK:  There are no carotid bruits.  There is no jugular venous  distention.  LUNGS:  Clear.  Respiratory effort is not labored.  CARDIAC:  An S1 with an S2.  There are no clicks or significant murmurs.  ABDOMEN:  Soft.  EXTREMITIES:  She has no peripheral edema.   EKG is normal.   Problems are listed on the note of January 16, 2007.  Her overall  cardiac status is stable.  She has no return of atrial fibrillation.  She is not  having any chest pain.  No further cardiac workup is needed.     Luis Abed, MD, [facility]  Electronically Signed    JDK/MedQ  DD: 07/09/2008  DT: 07/10/2008  Job #: [id number]

## 2010-09-26 NOTE — Discharge Summary (Signed)
NAMEZARAHI, FUERST NO.:  [id number]   MEDICAL RECORD NO.:  [id number]          PATIENT TYPE:  IPS   LOCATION:  0307                          FACILITY:  [facility]   PHYSICIAN:  Geoffery Lyons, M.D.      DATE OF BIRTH:  01/31/1941   DATE OF ADMISSION:  12/10/2007  DATE OF DISCHARGE:  12/12/2007                               DISCHARGE SUMMARY   CHIEF COMPLAINT/PRESENTING ILLNESS:  This was the first admission to  [facility] for this 70 year old female who was  referred by a psychiatrist after she expressed thoughts that she did not  want to live anymore.  Endorsed she was tired of living. Did not have a  lot of motivation to go on with life.  Denies having any plan to  actually take action to kill herself. Endorsed at least two weeks of low  energy, depressed mood, being more irritable and homeless.  Endorsed  being rather lonely and socially isolated. She has issues with  depression.  She moved to this area from [place]  4 years prior to  this admission.  Apparently, the trigger for this depression was  learning from her son that her daughter who lives near her in [place]  had asked her son to take her down to [place] to help care for  her. Endorsed that when she learned of this her feelings got very hurt  and felt abandoned by her children.  Claimed that in [place] has  not been seeing her grand kids for 6 months up until the last week when  she spent some time with her son at their home. Felt that she could not  approach her two sons or their wives because they live busy lives and  have no time for her. She is a retired Runner, broadcasting/film/video.  She has not pursued  much in the way of social contacts.   PAST PSYCHIATRIC HISTORY:  First time at [facility]. History of  prior admissions at [facility], being followed by Dr.  Waverly Ferrari and Tami Ribas for counseling.   PAST MEDICAL HISTORY:  Has a history of prior cerebral  vascular accident  with  acute renal failure accompanied by DIC.  Endorsed that her depression began after this illness and that she had  difficulty living alone since that time.   SECONDARY HISTORY:  Alcohol no history and denies active use of any  substances.   MEDICAL HISTORY:  As already stated, history of cerebral vascular  accident.   MEDICATIONS:  1. Ambien 3 mg at bedtime.  2. Celebrex 200 mg twice a day.  3. Klonopin 1 mg one to one and a half four times a day as needed.  4. Lamictal 150 mg at bedtime.  5. Synthroid 0.075 mg per day.  6. Wellbutrin 100 mg twice a day.  7. Fosamax one weekly.   PHYSICAL EXAMINATION:  Failed to show any acute findings.   LABORATORY WORK:  White blood cells 6.8, hemoglobin 13.8, sodium 138,  potassium 4, BUN 12, creatinine 1.4, glucose  103.  UDS positive for  benzodiazepines.   MENTAL STATUS EXAM:  Reveals a fully alert cooperative female engaged in  conversation, polite. Mood depressed.  Affect depressed. At times  tearful, very upset with the dynamics of her life right now, having come  from [place] with the expectation that she was going to be closer to  her family and this has not happened. Feeling rejected by her daughter,  but denied any active suicidal or homicidal ideas.  There was no  evidence of delusions, hallucinations.  Cognition was well-preserved.   ADMITTING DIAGNOSES:  AXIS I: Major depressive disorder.  AXIS II: No diagnosis.  AXIS III:  Left foot reconstruction; post CVA with mild left-sided  weakness; hypothyroidism.  AXIS IV: Moderate.  AXIS V:  Upon admission 40, highest GAF in the last year 70-75.   COURSE IN THE HOSPITAL:  She was admitted.  She was started in  individual and group psychotherapy. We continued her medication.  We  worked on Counsellor. As already stated, this  is a 69 year old female in an independent living facility in [place],  moved by her son's from [place] after the daughter in [place] told  them she could not deal with her any more. Upset as feels that the son  and the daughter have neglected her. They brought her to a place where  everyone else is much older than her. Most of them are in their 4s.  Difficulty with the fact that she had her foot reconstructed. She can  find no foot surgery with the kind of expertise that she would need to  address her foot now. Also dealing with the fact that she had a stroke,  feeling incapacitated, increased difficulties, got very depressed.  Wanted to talk to her therapist. The therapist was not available, saw  Dr. Waverly Ferrari. Feeling very distraught. Inpatient was recommended. She was  able to talk. Felt at that time that she was supported and we were able  to listen.  She felt better just by talking and then venting.  July 31,  was in full contact with reality.  There were no active suicidal or  homicidal ideas, no hallucinations or delusions. She wanted to change  things for herself.  Willing to consider options. Willing to come to  IOP.  Still feeling bad about the whole situation, but endorsed that she  would not hurt herself and was going to continue follow-up with Tami Ribas and Dr. Waverly Ferrari.   DISCHARGE DIAGNOSES:  AXIS I:  Major depressive disorder.  AXIS II: No diagnosis.  AXIS III:  Status post CVA.  Left foot reconstruction, left-sided  weakness, hypothyroidism.  AXIS IV: Moderate.  AXIS V:  Upon discharge 50, 55.   Discharged on Lamictal 150 mg at bedtime, Wellbutrin SR 100 mg twice a  day, Klonopin 1 mg four times a day as needed at bedtime as needed for  sleep, Fosamax 70 weekly.  Celebrex 200 mg per day and Synthroid 75 mcg  per day. Follow up Tami Ribas and Dr. Waverly Ferrari.      Geoffery Lyons, M.D.  Electronically Signed     IL/MEDQ  D:  01/14/2008  T:  01/14/2008  Job:  [id number]

## 2010-10-27 ENCOUNTER — Emergency Department (HOSPITAL_BASED_OUTPATIENT_CLINIC_OR_DEPARTMENT_OTHER)
Admission: EM | Admit: 2010-10-27 | Discharge: 2010-10-27 | Disposition: A | Discharge status: Home / Self Care | Payer: Medicare Other | Attending: Emergency Medicine | Admitting: Emergency Medicine

## 2010-10-27 ENCOUNTER — Emergency Department (INDEPENDENT_AMBULATORY_CARE_PROVIDER_SITE_OTHER): Payer: Medicare Other

## 2010-10-27 DIAGNOSIS — Z8679 Personal history of other diseases of the circulatory system: Secondary | ICD-10-CM | POA: Insufficient documentation

## 2010-10-27 DIAGNOSIS — R197 Diarrhea, unspecified: Secondary | ICD-10-CM | POA: Insufficient documentation

## 2010-10-27 DIAGNOSIS — F319 Bipolar disorder, unspecified: Secondary | ICD-10-CM | POA: Insufficient documentation

## 2010-10-27 DIAGNOSIS — Z79899 Other long term (current) drug therapy: Secondary | ICD-10-CM | POA: Insufficient documentation

## 2010-10-27 DIAGNOSIS — I4891 Unspecified atrial fibrillation: Secondary | ICD-10-CM | POA: Insufficient documentation

## 2010-10-27 LAB — DIFFERENTIAL
Lymphocytes Relative: 44 % (ref 12–46)
Monocytes Absolute: 0.5 10*3/uL (ref 0.1–1.0)
Monocytes Relative: 9 % (ref 3–12)
Neutro Abs: 2.8 10*3/uL (ref 1.7–7.7)

## 2010-10-27 LAB — COMPREHENSIVE METABOLIC PANEL
Alkaline Phosphatase: 55 U/L (ref 39–117)
BUN: 15 mg/dL (ref 6–23)
Calcium: 9.6 mg/dL (ref 8.4–10.5)
Creatinine, Ser: 0.7 mg/dL (ref 0.50–1.10)
GFR calc Af Amer: >60 mL/min (ref >60)
Glucose, Bld: 96 mg/dL (ref 70–99)
Potassium: 4.3 mEq/L (ref 3.5–5.1)
Total Protein: 6.9 g/dL (ref 6.0–8.3)

## 2010-10-27 LAB — URINALYSIS, ROUTINE W REFLEX MICROSCOPIC
Bilirubin Urine: NEGATIVE
Hgb urine dipstick: NEGATIVE
Nitrite: NEGATIVE
Protein, ur: NEGATIVE mg/dL
Urobilinogen, UA: 0.2 mg/dL (ref 0.0–1.0)

## 2010-10-27 LAB — CBC
HCT: 38.3 % (ref 36.0–46.0)
Hemoglobin: 13 g/dL (ref 12.0–15.0)
MCH: 29.9 pg (ref 26.0–34.0)
MCHC: 33.9 g/dL (ref 30.0–36.0)
MCV: 88 fL (ref 78.0–100.0)

## 2010-11-27 ENCOUNTER — Encounter: Payer: Self-pay | Admitting: *Deleted

## 2010-11-27 ENCOUNTER — Emergency Department (HOSPITAL_BASED_OUTPATIENT_CLINIC_OR_DEPARTMENT_OTHER)
Admission: EM | Admit: 2010-11-27 | Discharge: 2010-11-28 | Disposition: A | Discharge status: Home Health | Payer: Medicare Other | Attending: Emergency Medicine | Admitting: Emergency Medicine

## 2010-11-27 DIAGNOSIS — M7989 Other specified soft tissue disorders: Secondary | ICD-10-CM | POA: Insufficient documentation

## 2010-11-27 DIAGNOSIS — L03119 Cellulitis of unspecified part of limb: Secondary | ICD-10-CM | POA: Insufficient documentation

## 2010-11-27 DIAGNOSIS — Z8673 Personal history of transient ischemic attack (TIA), and cerebral infarction without residual deficits: Secondary | ICD-10-CM | POA: Insufficient documentation

## 2010-11-27 DIAGNOSIS — L0390 Cellulitis, unspecified: Secondary | ICD-10-CM

## 2010-11-27 DIAGNOSIS — L02419 Cutaneous abscess of limb, unspecified: Secondary | ICD-10-CM | POA: Insufficient documentation

## 2010-11-27 DIAGNOSIS — M79609 Pain in unspecified limb: Secondary | ICD-10-CM | POA: Insufficient documentation

## 2010-11-27 HISTORY — DX: Cerebral infarction, unspecified: I63.9

## 2010-11-27 HISTORY — DX: Hypothyroidism, unspecified: E03.9

## 2010-11-27 HISTORY — DX: Major depressive disorder, single episode, unspecified: F32.9

## 2010-11-27 HISTORY — DX: Depression, unspecified: F32.A

## 2010-11-27 HISTORY — DX: Disseminated intravascular coagulation (defibrination syndrome): D65

## 2010-11-27 HISTORY — DX: Anxiety disorder, unspecified: F41.9

## 2010-11-27 HISTORY — DX: Acute respiratory distress syndrome: J80

## 2010-11-27 NOTE — ED Notes (Signed)
Per EMS: pt has swelling to left foot since tonight. Hx of cellulitis

## 2010-11-28 ENCOUNTER — Encounter (HOSPITAL_BASED_OUTPATIENT_CLINIC_OR_DEPARTMENT_OTHER): Payer: Self-pay | Admitting: *Deleted

## 2010-11-28 ENCOUNTER — Emergency Department (INDEPENDENT_AMBULATORY_CARE_PROVIDER_SITE_OTHER): Payer: Medicare Other

## 2010-11-28 DIAGNOSIS — Z981 Arthrodesis status: Secondary | ICD-10-CM

## 2010-11-28 DIAGNOSIS — M79609 Pain in unspecified limb: Secondary | ICD-10-CM

## 2010-11-28 LAB — CBC
HCT: 35.3 % — ABNORMAL LOW (ref 36.0–46.0)
RBC: 4.01 MIL/uL (ref 3.87–5.11)
RDW: 12.7 % (ref 11.5–15.5)
WBC: 7.8 10*3/uL (ref 4.0–10.5)

## 2010-11-28 LAB — DIFFERENTIAL
Basophils Absolute: 0.1 10*3/uL (ref 0.0–0.1)
Lymphocytes Relative: 44 % (ref 12–46)
Monocytes Absolute: 0.6 10*3/uL (ref 0.1–1.0)
Neutro Abs: 3.6 10*3/uL (ref 1.7–7.7)

## 2010-11-28 LAB — BASIC METABOLIC PANEL
CO2: 26 mEq/L (ref 19–32)
Chloride: 106 mEq/L (ref 96–112)
Creatinine, Ser: 0.8 mg/dL (ref 0.50–1.10)
Sodium: 142 mEq/L (ref 135–145)

## 2010-11-28 MED ORDER — VANCOMYCIN HCL IN DEXTROSE 1-5 GM/200ML-% IV SOLN
1000.0000 mg | Freq: Once | INTRAVENOUS | Status: AC
  Administered 2010-11-28: 1000 mg via INTRAVENOUS
  Filled 2010-11-28: qty 200

## 2010-11-28 MED ORDER — DOXYCYCLINE HYCLATE 100 MG PO CAPS: 100.0000 mg | ORAL_CAPSULE | Freq: Two times a day (BID) | ORAL | Status: AC

## 2010-11-28 NOTE — ED Notes (Signed)
Pt updated on POC. Aware of ABX infusion. States she does not want her family notified that she is here "i dont want to worry them". Pt is alert and oriented. Lights dimmed for comfort. Call bell within reach. Pt instructed to notify staff of any additional needs or changes.

## 2010-11-28 NOTE — ED Provider Notes (Signed)
History     Chief Complaint  Patient presents with  . Leg Swelling   Patient is a 70 y.o. female presenting with lower extremity pain. The history is provided by the patient.  Foot Pain This is a new problem. The current episode started 12 to 24 hours ago. The problem occurs constantly. The problem has been gradually worsening. Pertinent negatives include no chest pain. Associated symptoms comments: Denies fever/weakness/trauma to foot. The symptoms are aggravated by standing. The symptoms are relieved by nothing.    Pt reports onset of left foot pain/redness today.  No new trauma, but has h/o surgery to left foot with recurrent cellulitis.  Past Medical History  Diagnosis Date  . CVA (cerebral vascular accident)   . ARDS (adult respiratory distress syndrome)   . DIC (disseminated intravascular coagulation)     No past surgical history on file.  No family history on file.  History  Substance Use Topics  . Smoking status: Not on file  . Smokeless tobacco: Not on file  . Alcohol Use:     OB History    Grav Para Term Preterm Abortions TAB SAB Ect Mult Living                  Review of Systems  Cardiovascular: Negative for chest pain.  All other systems reviewed and are negative.    Physical Exam   BP 100/59  Pulse 79  Temp(Src) 97.9 F (36.6 C) (Oral)  Resp 18  Ht 5\' 5"  (1.651 m)  Wt 114 lb (51.71 kg)  BMI 18.97 kg/m2  SpO2 98%  Physical Exam   CONSTITUTIONAL: Well developed/well nourished HEAD AND FACE: Normocephalic/atraumatic EYES: EOMI/PERRL ENMT: Mucous membranes moist NECK: supple no meningeal signs SPINE:entire spine nontender CV:no murmurs/rubs/gallops noted LUNGS: Lungs are clear to auscultation bilaterally, no apparent distress ABDOMEN: soft, nontender, no rebound or guarding  NEURO: Pt is awake/alert, no distress.  H/o CVA with left sided paresis  EXTREMITIES: pulses normal, full ROM, she had erythema/tenderness to dorsal aspect of left  foot that terminates at proximal foot.  There are postop changes noted to left great toe but no abscess/fluctuance/drainage noted. Well healed scab is noted on the left great toe, but no drainage noted and most erythema is proximal to this lesion.  There are no open sores/wound noted on foot or in between webspaces or on plantar aspect of foot No left calf tenderness noted SKIN: warm,  PSYCH: no abnormalities of mood noted  ED Course  Procedures  MDM Nursing notes reviewed and considered in documentation Previous records reviewed and considered All labs/vitals reviewed and considered xrays reviewed and considered  No progression of erythema.  Pt has no new pain.  Afebrile.  No abscess noted.  The erythema is mostly on dorsal aspect of left foot, does extend into ankle.  It does not streak proximally.  It does not extend into plantar surface When I review the last admission, the description of chronic changes to foot appear stable.  At this point I feel osteomyelitis is less likely, it is most likely cellulitis.  She is well appearing, nontoxic.  Loaded with abx here,  Will start outpatient doxy and needs close re-examination within 48 hours.  Pt reports she can be closely followed upon.        Joya Gaskins, MD 11/28/10 [phone]

## 2010-11-28 NOTE — ED Notes (Signed)
[facility] notified for transport back to [facility].

## 2010-11-28 NOTE — ED Notes (Signed)
Pt transported by PTAR to [facility] at this time.

## 2010-12-01 ENCOUNTER — Emergency Department (HOSPITAL_BASED_OUTPATIENT_CLINIC_OR_DEPARTMENT_OTHER)
Admission: EM | Admit: 2010-12-01 | Discharge: 2010-12-01 | Disposition: A | Discharge status: Other Acute Inpatient Hospital | Payer: Medicare Other | Attending: Emergency Medicine | Admitting: Emergency Medicine

## 2010-12-01 ENCOUNTER — Encounter (HOSPITAL_BASED_OUTPATIENT_CLINIC_OR_DEPARTMENT_OTHER): Payer: Self-pay | Admitting: Emergency Medicine

## 2010-12-01 DIAGNOSIS — L03116 Cellulitis of left lower limb: Secondary | ICD-10-CM

## 2010-12-01 DIAGNOSIS — L02619 Cutaneous abscess of unspecified foot: Secondary | ICD-10-CM | POA: Insufficient documentation

## 2010-12-01 DIAGNOSIS — Z79899 Other long term (current) drug therapy: Secondary | ICD-10-CM | POA: Insufficient documentation

## 2010-12-01 DIAGNOSIS — Z8679 Personal history of other diseases of the circulatory system: Secondary | ICD-10-CM | POA: Insufficient documentation

## 2010-12-01 DIAGNOSIS — E039 Hypothyroidism, unspecified: Secondary | ICD-10-CM | POA: Insufficient documentation

## 2010-12-01 DIAGNOSIS — F341 Dysthymic disorder: Secondary | ICD-10-CM | POA: Insufficient documentation

## 2010-12-01 DIAGNOSIS — L03119 Cellulitis of unspecified part of limb: Secondary | ICD-10-CM | POA: Insufficient documentation

## 2010-12-01 NOTE — ED Provider Notes (Signed)
History    History of Present Illness   Patient Identification [name] is a 70 y.o. female.  Patient information was obtained from patient. History/Exam limitations: none. Patient presented to the Emergency Department by private vehicle.  Chief Complaint  Foot Pain   Patient presents for evaluation of a possible skin infection. Onset of symptoms was gradual starting 5 days ago, with stable symptoms since that time. Symptoms include erythema located on dorsal surface of left foot and great toe. Patient reports she noted redness prior to first ED visit 11/27/10 and has had hx of similar infections in the past. There is not a history of trauma to the area. Treatment to date has included antibiotics started 4 days ago with moderate relief. This morning she noted increased redness up towards her ankle, but by the time of ED arrival no increased redness is present.  Pt was also seen by her PMD Dr. Larina Bras yesterday for evaluation of cellulitis.  No systemic symptoms, no fever/chills, no vomiting.   Past Medical History  Diagnosis Date  . CVA (cerebral vascular accident)   . ARDS (adult respiratory distress syndrome)   . DIC (disseminated intravascular coagulation)   . Anxiety   . Depression   . Hypothyroid    No family history on file. No current facility-administered medications for this encounter.   Current Outpatient Prescriptions  Medication Sig Dispense Refill  . Calcium Carbonate-Vitamin D (CALCIUM-VITAMIN D) 500-200 MG-UNIT per tablet Take 1 tablet by mouth 2 (two) times daily with a meal.       . celecoxib (CELEBREX) 200 MG capsule Take 200 mg by mouth daily.       . clonazePAM (KLONOPIN) 1 MG tablet Take 1 mg by mouth 2 (two) times daily as needed. anxiety      . doxycycline (VIBRAMYCIN) 100 MG capsule Take 1 capsule (100 mg total) by mouth 2 (two) times daily.  20 capsule  0  . lamoTRIgine (LAMICTAL) 150 MG tablet Take 150 mg by mouth daily.       Marland Kitchen levothyroxine (SYNTHROID,  LEVOTHROID) 75 MCG tablet Take 75 mcg by mouth daily.       . mirtazapine (REMERON) 7.5 MG tablet Take 7.5 mg by mouth at bedtime. sleep      . Vitamin D, Ergocalciferol, (DRISDOL) 50000 UNITS CAPS Take 50,000 Units by mouth every 7 (seven) days. First dose today       . zolpidem (AMBIEN CR) 6.25 MG CR tablet Take 6.25 mg by mouth at bedtime as needed. sleep       No Known Allergies History   Social History  . Marital Status: Divorced    Spouse Name: N/A    Number of Children: N/A  . Years of Education: N/A   Occupational History  . Not on file.   Social History Main Topics  . Smoking status: Former Games developer  . Smokeless tobacco: Not on file  . Alcohol Use: Yes     occasional glass of wine  . Drug Use: No  . Sexually Active:    Other Topics Concern  . Not on file   Social History Narrative  . No narrative on file    Review of Systems Pertinent items are noted in HPI.  10 Systems reviewed and are negative for acute change except as noted in the HPI.  Physical Exam   BP 92/51  Pulse 88  Temp 98.2 F (36.8 C)  Resp 18  SpO2 97% BP 92/51  Pulse 88  Temp  98.2 F (36.8 C)  Resp 18  SpO2 97% General appearance: alert, cooperative, appears stated age and no distress Head: Normocephalic, without obvious abnormality, atraumatic Lungs: CTAB CV: RRR no murmurs rubs or gallops Extremities: extremities normal, atraumatic, no cyanosis or edema and mild erythema on dorsal surface of left great toe and small amount on dorsum of left foot- no streaking up affected extremity, no significant warmth Pulses: 2+ and symmetric Skin: erythema over dorsum of left foot as described  ED Course   Studies: None indicated.  Records Reviewed: Old medical records. Nursing notes.  Treatments: continue doxycycline  Disposition: Home  Chief Complaint  Patient presents with  . Foot Pain   HPI  Past Medical History  Diagnosis Date  . CVA (cerebral vascular accident)   . ARDS  (adult respiratory distress syndrome)   . DIC (disseminated intravascular coagulation)   . Anxiety   . Depression   . Hypothyroid     Past Surgical History  Procedure Date  . Appendectomy   . Tonsillectomy     No family history on file.  History  Substance Use Topics  . Smoking status: Former Games developer  . Smokeless tobacco: Not on file  . Alcohol Use: Yes     occasional glass of wine    OB History    Grav Para Term Preterm Abortions TAB SAB Ect Mult Living                  Review of Systems  Physical Exam  BP 92/51  Pulse 88  Temp 98.2 F (36.8 C)  Resp 18  SpO2 97%  Physical Exam  ED Course  Procedures  MDM Pt on day 5 of doxycycline for cellulits of left foot/great toe.  Redness seems to be improving, pt is very nervous that infection will worsen as she has had severe infections in the past.  Reassurance provided and signs to look for.  Pt has also f/u with her PMD, Dr. Larina Bras and plans to be seen by him again early next week for another recheck.  Discharged with strict return precautions.  Pt agreeable with plan.

## 2010-12-01 NOTE — ED Notes (Signed)
Recheck for cellulitis Remains on Antibiotic therapy increased redness this AM

## 2011-02-02 ENCOUNTER — Encounter: Payer: Self-pay | Admitting: Cardiology

## 2011-02-02 DIAGNOSIS — Z95828 Presence of other vascular implants and grafts: Secondary | ICD-10-CM | POA: Insufficient documentation

## 2011-02-02 DIAGNOSIS — M21969 Unspecified acquired deformity of unspecified lower leg: Secondary | ICD-10-CM | POA: Insufficient documentation

## 2011-02-02 DIAGNOSIS — R079 Chest pain, unspecified: Secondary | ICD-10-CM | POA: Insufficient documentation

## 2011-02-02 DIAGNOSIS — I4891 Unspecified atrial fibrillation: Secondary | ICD-10-CM | POA: Insufficient documentation

## 2011-02-02 DIAGNOSIS — I351 Nonrheumatic aortic (valve) insufficiency: Secondary | ICD-10-CM | POA: Insufficient documentation

## 2011-02-02 DIAGNOSIS — R9430 Abnormal result of cardiovascular function study, unspecified: Secondary | ICD-10-CM | POA: Insufficient documentation

## 2011-02-02 DIAGNOSIS — E039 Hypothyroidism, unspecified: Secondary | ICD-10-CM | POA: Insufficient documentation

## 2011-02-02 DIAGNOSIS — I340 Nonrheumatic mitral (valve) insufficiency: Secondary | ICD-10-CM | POA: Insufficient documentation

## 2011-02-02 DIAGNOSIS — I639 Cerebral infarction, unspecified: Secondary | ICD-10-CM | POA: Insufficient documentation

## 2011-02-02 DIAGNOSIS — I5189 Other ill-defined heart diseases: Secondary | ICD-10-CM | POA: Insufficient documentation

## 2011-02-02 DIAGNOSIS — F419 Anxiety disorder, unspecified: Secondary | ICD-10-CM | POA: Insufficient documentation

## 2011-02-02 LAB — URINALYSIS, ROUTINE W REFLEX MICROSCOPIC
Glucose, UA: NEGATIVE
Glucose, UA: NEGATIVE
Ketones, ur: NEGATIVE
Protein, ur: NEGATIVE
Urobilinogen, UA: 0.2
pH: 5.5

## 2011-02-02 LAB — DIFFERENTIAL
Basophils Absolute: 0.1
Eosinophils Absolute: 0.2
Eosinophils Relative: 1
Lymphocytes Relative: 44
Lymphocytes Relative: 40
Lymphs Abs: 2.5
Monocytes Absolute: 0.4
Monocytes Relative: 8
Neutro Abs: 3.9
Neutrophils Relative %: 49

## 2011-02-02 LAB — CBC
HCT: 38.1
HCT: 41.1
Hemoglobin: 12.8
Hemoglobin: 13.7
MCV: 90.4
Platelets: 230
RBC: 4.18
WBC: 5.8
WBC: 8

## 2011-02-02 LAB — COMPREHENSIVE METABOLIC PANEL
Alkaline Phosphatase: 50
BUN: 16
Chloride: 109
Creatinine, Ser: 0.77
GFR calc non Af Amer: >60
Glucose, Bld: 75
Potassium: 4.2
Total Bilirubin: 0.7

## 2011-02-02 LAB — I-STAT 8, (EC8 V) (CONVERTED LAB)
Acid-base deficit: 1
BUN: 16
Chloride: 109
HCT: 39
Hemoglobin: 13.3
Operator id: 257131
Sodium: 141

## 2011-02-02 LAB — POCT I-STAT CREATININE: Creatinine, Ser: 1

## 2011-02-02 LAB — URINE MICROSCOPIC-ADD ON

## 2011-02-02 LAB — PROTIME-INR
INR: 0.9
Prothrombin Time: 11.9

## 2011-02-05 ENCOUNTER — Ambulatory Visit: Payer: Medicare Other | Admitting: Cardiology

## 2011-02-08 ENCOUNTER — Encounter: Payer: Self-pay | Admitting: Cardiology

## 2011-02-08 ENCOUNTER — Ambulatory Visit (INDEPENDENT_AMBULATORY_CARE_PROVIDER_SITE_OTHER): Payer: Medicare Other | Admitting: Cardiology

## 2011-02-08 DIAGNOSIS — R079 Chest pain, unspecified: Secondary | ICD-10-CM

## 2011-02-08 DIAGNOSIS — I4891 Unspecified atrial fibrillation: Secondary | ICD-10-CM

## 2011-02-08 DIAGNOSIS — I5189 Other ill-defined heart diseases: Secondary | ICD-10-CM

## 2011-02-08 DIAGNOSIS — I519 Heart disease, unspecified: Secondary | ICD-10-CM

## 2011-02-08 DIAGNOSIS — I059 Rheumatic mitral valve disease, unspecified: Secondary | ICD-10-CM

## 2011-02-08 DIAGNOSIS — I340 Nonrheumatic mitral (valve) insufficiency: Secondary | ICD-10-CM

## 2011-02-08 NOTE — Progress Notes (Signed)
HPI The patient is seen today for followup for history of atrial fibrillation and mitral regurgitation.  As part of today's evaluation I have reviewed the old records on this patient.  Her history is quite complex.  Recently she's been stable.  I have carefully updated information in the new EMR to be sure that none of the old lost.  Patient had a very significant illness with appendicitis and peritonitis in 2000.  She required an IVC filter.  He was placed at that time for DVT and DIC.  She recovered over time.  She's had mild mitral regurgitation.  There is no proven coronary disease.  A nuclear scan showed no ischemia in 2003.  Her last echo was 2005.  Ejection fraction was normal at that time.  Currently she continues to be limited physically by her prior stroke.  She did come in today walking with a walker.  She continues a rehabilitation to try to get stronger.  She's not having any chest pain or shortness of breath.  She has not felt any more palpitations. No Known Allergies  Current Outpatient Prescriptions  Medication Sig Dispense Refill  . alendronate (FOSAMAX) 70 MG tablet Take 70 mg by mouth every 7 (seven) days. Take with a full glass of water on an empty stomach.       . Calcium Carbonate-Vitamin D (CALCIUM-VITAMIN D) 500-200 MG-UNIT per tablet Take 1 tablet by mouth 2 (two) times daily with a meal.       . celecoxib (CELEBREX) 200 MG capsule Take 200 mg by mouth daily.       . clonazePAM (KLONOPIN) 1 MG tablet Take 1 mg by mouth 4 (four) times daily. anxiety      . lamoTRIgine (LAMICTAL) 150 MG tablet Take 150 mg by mouth daily.       Marland Kitchen levothyroxine (SYNTHROID, LEVOTHROID) 75 MCG tablet Take 75 mcg by mouth daily.       . mirtazapine (REMERON) 7.5 MG tablet Take 7.5 mg by mouth at bedtime. sleep      . POLYETHYLENE GLYCOL 3350 PO Take by mouth. As directed       . Vitamin D, Ergocalciferol, (DRISDOL) 50000 UNITS CAPS Take 50,000 Units by mouth every 7 (seven) days. First dose today        . zolpidem (AMBIEN CR) 6.25 MG CR tablet Take 6.25 mg by mouth at bedtime as needed. sleep        History   Social History  . Marital Status: Divorced    Spouse Name: N/A    Number of Children: N/A  . Years of Education: N/A   Occupational History  . Not on file.   Social History Main Topics  . Smoking status: Former Games developer  . Smokeless tobacco: Not on file  . Alcohol Use: Yes     occasional glass of wine  . Drug Use: No  . Sexually Active:    Other Topics Concern  . Not on file   Social History Narrative  . No narrative on file    No family history on file.  Past Medical History  Diagnosis Date  . CVA (cerebral vascular accident)     Left hemiparesis, at the time of critical illness in the past  . ARDS (adult respiratory distress syndrome)   . DIC (disseminated intravascular coagulation)   . Anxiety   . Depression   . Hypothyroid   . Atrial fibrillation     Paroxysmal, at times is severe illness in the past  .  S/P IVC filter     Placed 2000 with severe illness  . Mitral regurgitation     Mild  . Chest pain     Nuclear, 2003, no ischemia  . Ejection fraction     EF 60%, echo, December, 2008  . Diastolic dysfunction   . Aortic insufficiency     Mild, echo, 2008  . Foot deformity     Congenitally abnormal left foot, multiple operations over time  . 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 Appendectomy   . Tonsillectomy     ROS  Patient denies fever, chills, headache, sweats, rash, change in vision, change in hearing, chest pain, cough, nausea vomiting, urinary symptoms.  All other systems are reviewed and are negative.  PHYSICAL EXAM Patient walked in with her walker today.  She is oriented to person time and place.  Affect is normal.  Head is atraumatic.  There is no jugular venous distention.  Lungs are clear.  Respiratory effort is not labored.  Cardiac exam  reveals an S1 and S2.  I do not hear significant murmurs today.  Her abdomen is soft.  There no skin rashes.  She has the musculoskeletal limitations related to her prior CVA 10 years ago. Filed Vitals:   02/08/11 1112  BP: 117/74  Pulse: 99  Height: 5\' 6"  (1.676 m)  Weight: 115 lb 1.9 oz (52.218 kg)    EKG is not done today.  Her rhythm is regular on physical exam.  ASSESSMENT & PLAN

## 2011-02-08 NOTE — Assessment & Plan Note (Signed)
The patient has not had any significant recurrent chest pain.  She does not need any further workup at this time.

## 2011-02-08 NOTE — Patient Instructions (Signed)
Your physician wants you to follow-up in: 12 MONTHS WITH DR Chancy Hurter will receive a reminder letter in the mail two months in advance. If you don't receive a letter, please call our office to schedule the follow-up appointment.  Your physician recommends that you continue on your current medications as directed. Please refer to the Current Medication list given to you today.

## 2011-02-08 NOTE — Assessment & Plan Note (Signed)
The patient had atrial fibrillation with her severe illness in 2000.  She has not had any symptomatic recurrence.  Her rhythm is regular on exam today.

## 2011-02-08 NOTE — Assessment & Plan Note (Signed)
The patient had mild mitral regurgitation and mild aortic insufficiency on her echo in 2008.  She is clinically stable.  When I see her next year we will consider a followup echo and will represent a five-year period

## 2011-02-08 NOTE — Assessment & Plan Note (Signed)
Historically she has some diastolic dysfunction by echo.  However she has no signs of congestive heart failure.  No further workup.

## 2011-02-09 LAB — DIFFERENTIAL
Eosinophils Relative: 2
Lymphocytes Relative: 37
Lymphs Abs: 2.5
Monocytes Relative: 7

## 2011-02-09 LAB — CBC
HCT: 41.1
Platelets: 315
RBC: 4.52
WBC: 6.8

## 2011-02-09 LAB — RAPID URINE DRUG SCREEN, HOSP PERFORMED
Barbiturates: NOT DETECTED
Benzodiazepines: POSITIVE — AB

## 2011-02-09 LAB — POCT I-STAT, CHEM 8
BUN: 12
Creatinine, Ser: 1.4 — ABNORMAL HIGH
Glucose, Bld: 103 — ABNORMAL HIGH
Potassium: 4
Sodium: 138
TCO2: 19

## 2011-02-09 LAB — ETHANOL: Alcohol, Ethyl (B): <5

## 2011-02-22 ENCOUNTER — Emergency Department (INDEPENDENT_AMBULATORY_CARE_PROVIDER_SITE_OTHER): Payer: Medicare Other

## 2011-02-22 ENCOUNTER — Encounter (HOSPITAL_BASED_OUTPATIENT_CLINIC_OR_DEPARTMENT_OTHER): Payer: Self-pay | Admitting: *Deleted

## 2011-02-22 ENCOUNTER — Emergency Department (HOSPITAL_BASED_OUTPATIENT_CLINIC_OR_DEPARTMENT_OTHER)
Admission: EM | Admit: 2011-02-22 | Discharge: 2011-02-22 | Disposition: A | Discharge status: Home / Self Care | Payer: Medicare Other | Attending: Emergency Medicine | Admitting: Emergency Medicine

## 2011-02-22 DIAGNOSIS — I69959 Hemiplegia and hemiparesis following unspecified cerebrovascular disease affecting unspecified side: Secondary | ICD-10-CM | POA: Insufficient documentation

## 2011-02-22 DIAGNOSIS — F341 Dysthymic disorder: Secondary | ICD-10-CM | POA: Insufficient documentation

## 2011-02-22 DIAGNOSIS — S99919A Unspecified injury of unspecified ankle, initial encounter: Secondary | ICD-10-CM

## 2011-02-22 DIAGNOSIS — E039 Hypothyroidism, unspecified: Secondary | ICD-10-CM | POA: Insufficient documentation

## 2011-02-22 DIAGNOSIS — Z79899 Other long term (current) drug therapy: Secondary | ICD-10-CM | POA: Insufficient documentation

## 2011-02-22 DIAGNOSIS — S99929A Unspecified injury of unspecified foot, initial encounter: Secondary | ICD-10-CM

## 2011-02-22 DIAGNOSIS — X58XXXA Exposure to other specified factors, initial encounter: Secondary | ICD-10-CM | POA: Insufficient documentation

## 2011-02-22 DIAGNOSIS — Y92009 Unspecified place in unspecified non-institutional (private) residence as the place of occurrence of the external cause: Secondary | ICD-10-CM | POA: Insufficient documentation

## 2011-02-22 DIAGNOSIS — M79609 Pain in unspecified limb: Secondary | ICD-10-CM | POA: Insufficient documentation

## 2011-02-22 MED ORDER — CEPHALEXIN 500 MG PO CAPS: 500.0000 mg | ORAL_CAPSULE | Freq: Four times a day (QID) | ORAL | Status: AC

## 2011-02-22 NOTE — ED Notes (Signed)
Bent left foot toes back by accident and now having some discoloration to her foot. Voices having previous surgery with hardware placement in same foot

## 2011-02-22 NOTE — ED Notes (Signed)
Care plan and basic wound care reviewed

## 2011-02-22 NOTE — ED Provider Notes (Signed)
History     CSN: [id number] Arrival date & time: 02/22/2011  3:35 PM  Chief Complaint  Patient presents with  . Toe Pain    HPI  70 year old female history of congenital left foot abnormality, CVA with left-sided hemiparesis presents with left toe drainage. Patient states that 2 days ago she was sitting on the floor and "when I got up I bent my toe funny". States that she noted a bout of blood at that time. States that since that time she's had no pain. She complains of mild drainage from the site. She denies fever, chills. States that her toe is generally red. There is no swelling from baseline. States that there is no deformity from her baseline. She denies numbness tingling weakness of her toe. She remained in good order with her walker. The patient is mainly concerned that the hardware in her foot has been compromised.   Merian Capron, RN 02/22/2011 15:31    Bent left foot toes back by accident and now having some discoloration to her foot. Voices having previous surgery with hardware placement in same foot      Past Medical History  Diagnosis Date  . CVA (cerebral vascular accident)     Left hemiparesis, at the time of critical illness in the past  . ARDS (adult respiratory distress syndrome)   . DIC (disseminated intravascular coagulation)   . Anxiety   . Depression   . Hypothyroid   . Atrial fibrillation     Paroxysmal, at times is severe illness in the past  . S/P IVC filter     Placed 2000 with severe illness  . Mitral regurgitation     Mild  . Chest pain     Nuclear, 2003, no ischemia  . Ejection fraction     EF 60%, echo, December, 2008  . Diastolic dysfunction   . Aortic insufficiency     Mild, echo, 2008  . Foot deformity     Congenitally abnormal left foot, multiple operations over time  . 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 Appendectomy   . Tonsillectomy      No family history on file.  History  Substance Use Topics  . Smoking status: Former Games developer  . Smokeless tobacco: Not on file  . Alcohol Use: Yes     occasional glass of wine    OB History    Grav Para Term Preterm Abortions TAB SAB Ect Mult Living                  Review of Systems Negative except as noted in history of present illness  Allergies  Review of patient's allergies indicates no known allergies.  Home Medications   Current Outpatient Rx  Name Route Sig Dispense Refill  . ALENDRONATE SODIUM 70 MG PO TABS Oral Take 70 mg by mouth every 7 (seven) days. Take with a full glass of water on an empty stomach.     Marland Kitchen CALCIUM-VITAMIN D 500-200 MG-UNIT PO TABS Oral Take 1 tablet by mouth daily.     . CELECOXIB 200 MG PO CAPS Oral Take 200 mg by mouth daily.     Marland Kitchen CLONAZEPAM 1 MG PO TABS Oral Take 1 mg by mouth 4 (four) times daily as needed. For anxiety    . LAMOTRIGINE 150 MG PO TABS Oral Take 150 mg by mouth daily.     Marland Kitchen  LEVOTHYROXINE SODIUM 75 MCG PO TABS Oral Take 75 mcg by mouth daily.     Marland Kitchen MIRTAZAPINE 15 MG PO TABS Oral Take 7.5 mg by mouth at bedtime.      Marland Kitchen MIRTAZAPINE 7.5 MG PO TABS Oral Take 7.5 mg by mouth at bedtime. sleep    . POLYETHYLENE GLYCOL 3350 PO Oral Take by mouth. As directed     . POLYETHYLENE GLYCOL 3350 PO POWD Oral Take 8 g by mouth daily.      Marland Kitchen VITAMIN D (ERGOCALCIFEROL) 50000 UNITS PO CAPS Oral Take 50,000 Units by mouth every 7 (seven) days. First dose today     . ZOLPIDEM TARTRATE ER 6.25 MG PO TBCR Oral Take 3.125 mg by mouth at bedtime as needed. For sleep      BP 121/68  Pulse 93  Temp 98.2 F (36.8 C)  Resp 16  SpO2 99%  Physical Exam  Nursing note and vitals reviewed. Constitutional: She is oriented to person, place, and time. She appears well-developed.  HENT:  Head: Atraumatic.  Mouth/Throat: Oropharynx is clear and moist.  Eyes: Conjunctivae and EOM are normal. Pupils are equal, round, and reactive to light.  Neck:  Normal range of motion. Neck supple.  Cardiovascular: Normal rate, regular rhythm, normal heart sounds and intact distal pulses.   Pulmonary/Chest: Effort normal and breath sounds normal. No respiratory distress. She has no wheezes. She has no rales.  Abdominal: Soft. She exhibits no distension. There is no tenderness. There is no rebound and no guarding.  Musculoskeletal: Normal range of motion.       Left foot with atrophy diffusely. Left great toe with mild erythema. There is lateral small open wound. With a minimal amount of yellow crusty drainage. Minimal surrounding erythema. There is no tenderness to palpation the toe is slightly warm.  Neurological: She is alert and oriented to person, place, and time.  Skin: Skin is warm and dry. No rash noted.  Psychiatric: She has a normal mood and affect.    ED Course  Procedures (including critical care time)  Labs Reviewed - No data to display Dg Toe Great Left  02/22/2011  *RADIOLOGY REPORT*  Clinical Data: Left first toe injury with a history prior reconstructive surgery of the foot.  LEFT TOE - 2+ VIEW  Comparison: 11/28/2010  Findings: Stable appearance of hardware at the level of the first toe.  No fractures identified.  Alignment at the interphalangeal joint appears slightly different compared to the prior study and there may be component of subluxation.  However, the toe may simply be bent to a greater degree.  IMPRESSION: No acute fracture.  As above, slightly different alignment at the interphalangeal joint which may relate to positioning.  Correlation suggested with any clinical evidence of subluxation.  Original Report Authenticated By: Reola Calkins, M.D.     No diagnosis found.    MDM  X-ray as above. Her hardware is in place the patient has been reassured by this. Given the normal amount of discharge and redness will cover her with antibiotics and have her follow up with her podiatrist early next week. Patient comfortable  with this plan.  Stefano Gaul, MD         Forbes Cellar, MD 02/22/11 [phone]

## 2011-02-22 NOTE — ED Notes (Signed)
Pt reports wound between R great toe with poor healing no redness no edema noted

## 2011-03-06 ENCOUNTER — Telehealth: Payer: Self-pay | Admitting: Cardiology

## 2011-03-06 NOTE — Telephone Encounter (Signed)
Mrs [name] is requesting an appt with Dr Myrtis Ser for her brother who is here visiting.  He is a 72 something old female who has been having an "uncomfortable feeling in his chest".  She doesn't know how long he has been having it or how often he gets it.  She states she has a lot of faith in Dr Myrtis Ser and her brother is worried by these symptoms.  He is not having the uncomfortable feeling currently and has had a negative cardiac workup in the past and was diagnosed with acid reflux.  She states they are worried that it is something more serious than reflux.  She was told that Dr Myrtis Ser is not accepting new pts but wants to know if he will make an exception for her brother?

## 2011-03-06 NOTE — Telephone Encounter (Signed)
I will be happy to see the patient.  We cannot add him today as I may have to leave for a doctor's appointment.

## 2011-03-06 NOTE — Telephone Encounter (Signed)
NA

## 2011-03-06 NOTE — Telephone Encounter (Signed)
N/A.  LMTC. 

## 2011-03-06 NOTE — Telephone Encounter (Signed)
Pt was notified and will call back to make an appt with Dr Myrtis Ser for her brother.  She is not able to make it now and he needs to be registered.

## 2011-03-06 NOTE — Telephone Encounter (Signed)
Pt returning call to our office from someone. Please call back.

## 2011-03-06 NOTE — Telephone Encounter (Signed)
Pt calling wanting to know if Dr. Myrtis Ser could see pt family member. Pt family is not a pt of Dr. Myrtis Ser, he is visiting from out of town. Pt was made aware that Dr. Myrtis Ser was not accepting any new pts however pt insisted that Dr. Henrietta Hoover nurse be contacted to double check with her.

## 2011-03-07 ENCOUNTER — Emergency Department (HOSPITAL_BASED_OUTPATIENT_CLINIC_OR_DEPARTMENT_OTHER)
Admission: EM | Admit: 2011-03-07 | Discharge: 2011-03-07 | Disposition: A | Discharge status: Home / Self Care | Payer: Medicare Other | Attending: Emergency Medicine | Admitting: Emergency Medicine

## 2011-03-07 ENCOUNTER — Emergency Department (INDEPENDENT_AMBULATORY_CARE_PROVIDER_SITE_OTHER): Payer: Medicare Other

## 2011-03-07 ENCOUNTER — Encounter (HOSPITAL_BASED_OUTPATIENT_CLINIC_OR_DEPARTMENT_OTHER): Payer: Self-pay | Admitting: *Deleted

## 2011-03-07 DIAGNOSIS — X58XXXA Exposure to other specified factors, initial encounter: Secondary | ICD-10-CM

## 2011-03-07 DIAGNOSIS — R609 Edema, unspecified: Secondary | ICD-10-CM

## 2011-03-07 DIAGNOSIS — I4891 Unspecified atrial fibrillation: Secondary | ICD-10-CM | POA: Insufficient documentation

## 2011-03-07 DIAGNOSIS — M79609 Pain in unspecified limb: Secondary | ICD-10-CM | POA: Insufficient documentation

## 2011-03-07 DIAGNOSIS — Z8679 Personal history of other diseases of the circulatory system: Secondary | ICD-10-CM | POA: Insufficient documentation

## 2011-03-07 DIAGNOSIS — M79675 Pain in left toe(s): Secondary | ICD-10-CM

## 2011-03-07 DIAGNOSIS — F341 Dysthymic disorder: Secondary | ICD-10-CM | POA: Insufficient documentation

## 2011-03-07 DIAGNOSIS — Z79899 Other long term (current) drug therapy: Secondary | ICD-10-CM | POA: Insufficient documentation

## 2011-03-07 DIAGNOSIS — S8990XA Unspecified injury of unspecified lower leg, initial encounter: Secondary | ICD-10-CM

## 2011-03-07 LAB — CBC
Platelets: 273 10*3/uL (ref 150–400)
RDW: 13.1 % (ref 11.5–15.5)
WBC: 7 10*3/uL (ref 4.0–10.5)

## 2011-03-07 LAB — BASIC METABOLIC PANEL
CO2: 27 mEq/L (ref 19–32)
Chloride: 107 mEq/L (ref 96–112)
Potassium: 3.9 mEq/L (ref 3.5–5.1)
Sodium: 143 mEq/L (ref 135–145)

## 2011-03-07 LAB — DIFFERENTIAL
Basophils Absolute: 0 10*3/uL (ref 0.0–0.1)
Lymphocytes Relative: 34 % (ref 12–46)
Neutro Abs: 3.9 10*3/uL (ref 1.7–7.7)

## 2011-03-07 LAB — C-REACTIVE PROTEIN: CRP: 0.08 mg/dL — ABNORMAL LOW (ref <0.60)

## 2011-03-07 LAB — SEDIMENTATION RATE: Sed Rate: 4 mm/hr (ref 0–22)

## 2011-03-07 MED ORDER — DOXYCYCLINE HYCLATE 100 MG PO CAPS: 100.0000 mg | ORAL_CAPSULE | Freq: Two times a day (BID) | ORAL | Status: AC

## 2011-03-07 NOTE — ED Provider Notes (Signed)
History     CSN: [id number] Arrival date & time: 03/07/2011  9:50 AM   First MD Initiated Contact with Patient 03/07/11 [phone]      Chief Complaint  Patient presents with  . Foot Pain    Left foot    (Consider location/radiation/quality/duration/timing/severity/associated sxs/prior treatment) HPI Comments: Pt notes possible toe injury about a week and a half ago, cracked the skin of the dorsal L great toe.  Pt seen and placed on keflex on 10/11 and had a negative x ray.  Pt notes new "tightness and warmth" of the toe today.  Pt denies new redness, swelling or fever.  Pt also seen by her PCP yesterday, placed on zithromax yesterday.    Patient is a 70 y.o. female presenting with toe pain.  Toe Pain This is a recurrent problem. Episode onset: about 1 and a half weeks ago. The problem has been gradually worsening. Pertinent negatives include no fever or rash. The symptoms are aggravated by nothing. Treatments tried: keflex, azithromycin. The treatment provided no relief.  Toe Pain This is a recurrent problem. Episode onset: about 1 and a half weeks ago. The problem has been gradually worsening. The symptoms are aggravated by nothing. Treatments tried: keflex, azithromycin. The treatment provided no relief.    Past Medical History  Diagnosis Date  . CVA (cerebral vascular accident)     Left hemiparesis, at the time of critical illness in the past  . ARDS (adult respiratory distress syndrome)   . DIC (disseminated intravascular coagulation)   . Anxiety   . Depression   . Hypothyroid   . Atrial fibrillation     Paroxysmal, at times is severe illness in the past  . S/P IVC filter     Placed 2000 with severe illness  . Mitral regurgitation     Mild  . Chest pain     Nuclear, 2003, no ischemia  . Ejection fraction     EF 60%, echo, December, 2008  . Diastolic dysfunction   . Aortic insufficiency     Mild, echo, 2008  . Foot deformity     Congenitally abnormal left foot, multiple  operations over time  . 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 Appendectomy   . Tonsillectomy   . Foot surgery     left foot multiple surgeries    No family history on file.  History  Substance Use Topics  . Smoking status: Former Games developer  . Smokeless tobacco: Not on file  . Alcohol Use: Yes     occasional glass of wine    OB History    Grav Para Term Preterm Abortions TAB SAB Ect Mult Living                  Review of Systems  Constitutional: Negative for fever.  Skin: Negative for rash.  All other systems reviewed and are negative.    Allergies  Review of patient's allergies indicates no known allergies.  Home Medications   Current Outpatient Rx  Name Route Sig Dispense Refill  . ALENDRONATE SODIUM 70 MG PO TABS Oral Take 70 mg by mouth every 7 (seven) days. Take with a full glass of water on an empty stomach.     Marland Kitchen CALCIUM-VITAMIN D 500-200 MG-UNIT PO TABS Oral Take 1 tablet by mouth daily.     . CELECOXIB 200 MG PO CAPS Oral  Take 200 mg by mouth daily.     Marland Kitchen CLONAZEPAM 1 MG PO TABS Oral Take 1 mg by mouth 4 (four) times daily as needed. For anxiety    . LAMOTRIGINE 150 MG PO TABS Oral Take 150 mg by mouth daily.     Marland Kitchen LEVOTHYROXINE SODIUM 75 MCG PO TABS Oral Take 75 mcg by mouth daily.     Marland Kitchen MIRTAZAPINE 15 MG PO TABS Oral Take 7.5 mg by mouth at bedtime.      Marland Kitchen MIRTAZAPINE 7.5 MG PO TABS Oral Take 7.5 mg by mouth at bedtime. sleep    . POLYETHYLENE GLYCOL 3350 PO Oral Take by mouth. As directed     . POLYETHYLENE GLYCOL 3350 PO POWD Oral Take 8 g by mouth daily.     Marland Kitchen VITAMIN D (ERGOCALCIFEROL) 50000 UNITS PO CAPS Oral Take 50,000 Units by mouth every 7 (seven) days. First dose today     . ZOLPIDEM TARTRATE ER 6.25 MG PO TBCR Oral Take 3.125 mg by mouth at bedtime as needed. For sleep      BP 109/58  Pulse 76  Temp(Src) 97.4 F (36.3 C) (Oral)  Resp 20  SpO2  100%  Physical Exam  Constitutional: She is oriented to person, place, and time. She appears well-developed and well-nourished.  HENT:  Head: Normocephalic.  Pulmonary/Chest: Effort normal.  Musculoskeletal:       Left foot: She exhibits deformity. She exhibits normal range of motion, no tenderness, no bony tenderness and no swelling.       Feet:  Neurological: She is alert and oriented to person, place, and time.  Skin: Skin is warm and dry. No rash noted. No erythema.    ED Course  Procedures (including critical care time)   Dg Toe Great Left  03/07/2011  *RADIOLOGY REPORT*  Clinical Data: Drainage and swelling.  Trauma the left foot. Status post prior surgeries.  LEFT TOE - 2+ VIEW  Comparison: 02/22/2011.  Findings: The patient is status post ORIF with fusion across the first MTP joints and the first TMT joint.  Advanced degenerative change and subluxation at the IP joint of the great toe is stable. Soft tissue swelling overlying the first metatarsal is stable. Erosion of the head of the proximal phalanx in the second digit is stable.  Subluxation of the second MTP joint is stable.  IMPRESSION:  1.  No acute abnormality or significant interval change. 2.  Status post ORIF of the first metatarsal and fusion of the first TMT and MTP joints. 3.  Stable degenerative changes and subluxation. 4.  Stable soft tissue swelling along the dorsum of the foot. Although there is no significant interval change, infection cannot be excluded.  Original Report Authenticated By: Jamesetta Orleans. MATTERN, M.D.   Results for orders placed during the hospital encounter of 03/07/11  CBC      Component Value Range   WBC 7.0  4.0 - 10.5 (K/uL)   RBC 4.22  3.87 - 5.11 (MIL/uL)   Hemoglobin 12.9  12.0 - 15.0 (g/dL)   HCT 96.0  45.4 - 09.8 (%)   MCV 89.6  78.0 - 100.0 (fL)   MCH 30.6  26.0 - 34.0 (pg)   MCHC 34.1  30.0 - 36.0 (g/dL)   RDW 11.9  14.7 - 82.9 (%)   Platelets 273  150 - 400 (K/uL)  DIFFERENTIAL       Component Value Range   Neutrophils Relative 56  43 - 77 (%)  Neutro Abs 3.9  1.7 - 7.7 (K/uL)   Lymphocytes Relative 34  12 - 46 (%)   Lymphs Abs 2.4  0.7 - 4.0 (K/uL)   Monocytes Relative 8  3 - 12 (%)   Monocytes Absolute 0.6  0.1 - 1.0 (K/uL)   Eosinophils Relative 1  0 - 5 (%)   Eosinophils Absolute 0.1  0.0 - 0.7 (K/uL)   Basophils Relative 0  0 - 1 (%)   Basophils Absolute 0.0  0.0 - 0.1 (K/uL)  BASIC METABOLIC PANEL      Component Value Range   Sodium 143  135 - 145 (mEq/L)   Potassium 3.9  3.5 - 5.1 (mEq/L)   Chloride 107  96 - 112 (mEq/L)   CO2 27  19 - 32 (mEq/L)   Glucose, Bld 100 (*) 70 - 99 (mg/dL)   BUN 17  6 - 23 (mg/dL)   Creatinine, Ser 1.61  0.50 - 1.10 (mg/dL)   Calcium 9.3  8.4 - 09.6 (mg/dL)   GFR calc non Af Amer 87 (*) >90 (mL/min)   GFR calc Af Amer >90  >90 (mL/min)  SEDIMENTATION RATE      Component Value Range   Sed Rate 4  0 - 22 (mm/hr)  C-REACTIVE PROTEIN      Component Value Range   CRP 0.08 (*) <0.60 (mg/dL)   Dg Toe Great Left  04/54/0981  *RADIOLOGY REPORT*  Clinical Data: Drainage and swelling.  Trauma the left foot. Status post prior surgeries.  LEFT TOE - 2+ VIEW  Comparison: 02/22/2011.  Findings: The patient is status post ORIF with fusion across the first MTP joints and the first TMT joint.  Advanced degenerative change and subluxation at the IP joint of the great toe is stable. Soft tissue swelling overlying the first metatarsal is stable. Erosion of the head of the proximal phalanx in the second digit is stable.  Subluxation of the second MTP joint is stable.  IMPRESSION:  1.  No acute abnormality or significant interval change. 2.  Status post ORIF of the first metatarsal and fusion of the first TMT and MTP joints. 3.  Stable degenerative changes and subluxation. 4.  Stable soft tissue swelling along the dorsum of the foot. Although there is no significant interval change, infection cannot be excluded.  Original Report Authenticated  By: Jamesetta Orleans. MATTERN, M.D.   Dg Toe Great Left  02/22/2011  *RADIOLOGY REPORT*  Clinical Data: Left first toe injury with a history prior reconstructive surgery of the foot.  LEFT TOE - 2+ VIEW  Comparison: 11/28/2010  Findings: Stable appearance of hardware at the level of the first toe.  No fractures identified.  Alignment at the interphalangeal joint appears slightly different compared to the prior study and there may be component of subluxation.  However, the toe may simply be bent to a greater degree.  IMPRESSION: No acute fracture.  As above, slightly different alignment at the interphalangeal joint which may relate to positioning.  Correlation suggested with any clinical evidence of subluxation.  Original Report Authenticated By: Reola Calkins, M.D.   Pt with Hx of surgery on the left great toe who has had redness and drainage of a small amount of serous fluid over about ten days.  She had prior hospitalization for cellulitis of her left great toe in February of this year.  She had been on Keflex for a week, and was changed to azithromycin yesterday by her PCP.  Exam today shows redness  and tenderness over the MTP joint of the left great toe on the dorsum.  X-rays showed the pre-existing orthopedic hardware, but no evidence of osteomyelitis.  She has been seen by Dr. Lestine Box in the past, when hospitalized.  Recommend followup with him.   Osvaldo Human, M.D.    No diagnosis found. Discussed with Dr Ignacia Palma who has seen the pt and recommends discussing with Dr Lestine Box.  \ [id number]- Discussed with Alycia Rossetti (PA) for [facility].  He recommends covering for MRSA with doxycycline and to follow up closely with Dr Lestine Box in the office.    Pt understands d/c instructions.   MDM  Will place on doxycycline to cover for MRSA.  No signs of OM.     Medical screening examination/treatment/procedure(s) were conducted as a shared visit with non-physician practitioner(s) and myself.  I  personally evaluated the patient during the encounter Osvaldo Human, M.D.      Achilles Dunk [place], [place] 03/07/11 [address] [place], [place] 03/07/11 1337  Carleene Cooper III, MD 03/07/11 [phone]

## 2011-03-07 NOTE — ED Notes (Signed)
Patient states 5 days ago she used her left toe to lift her up off of the floor and developed an abrasion on the left great toe.  Patient has chronic problems with her left foot from an old stroke.  Was seen here 5 days ago and started on an antibiotic.  Over the weekend she had diarrhea from the antibiotic at the same time.  Was seen by [place] PCP yesterday and was given a cream to use on the foot.  This am she woke up and with a tightness in her left foot.

## 2011-03-09 LAB — WOUND CULTURE

## 2011-03-11 NOTE — ED Notes (Signed)
Results rcvd from [facility]  (+) Wound -> few staph species.  Pt was given Rx for Doxycycline after ED PA had conversation w/ortho PA for Dr Lestine Box,  Alycia Rossetti PA.  Pt instructed to f/u w/Dr Lestine Box.  Cx results faxed to Dr Lestine Box attn fax# [phone].

## 2011-03-20 ENCOUNTER — Ambulatory Visit: Payer: Medicare Other | Admitting: Cardiology

## 2011-04-16 ENCOUNTER — Emergency Department (HOSPITAL_BASED_OUTPATIENT_CLINIC_OR_DEPARTMENT_OTHER)
Admission: EM | Admit: 2011-04-16 | Discharge: 2011-04-17 | Disposition: A | Discharge status: Home / Self Care | Payer: Medicare Other | Attending: Emergency Medicine | Admitting: Emergency Medicine

## 2011-04-16 ENCOUNTER — Encounter (HOSPITAL_BASED_OUTPATIENT_CLINIC_OR_DEPARTMENT_OTHER): Payer: Self-pay | Admitting: *Deleted

## 2011-04-16 DIAGNOSIS — Z79899 Other long term (current) drug therapy: Secondary | ICD-10-CM | POA: Insufficient documentation

## 2011-04-16 DIAGNOSIS — L03039 Cellulitis of unspecified toe: Secondary | ICD-10-CM | POA: Insufficient documentation

## 2011-04-16 DIAGNOSIS — F341 Dysthymic disorder: Secondary | ICD-10-CM | POA: Insufficient documentation

## 2011-04-16 DIAGNOSIS — E039 Hypothyroidism, unspecified: Secondary | ICD-10-CM | POA: Insufficient documentation

## 2011-04-16 DIAGNOSIS — L02619 Cutaneous abscess of unspecified foot: Secondary | ICD-10-CM | POA: Insufficient documentation

## 2011-04-16 DIAGNOSIS — Z8679 Personal history of other diseases of the circulatory system: Secondary | ICD-10-CM | POA: Insufficient documentation

## 2011-04-16 NOTE — ED Notes (Signed)
Pt c/o left big toe with redness and pain x 1 week

## 2011-04-17 ENCOUNTER — Emergency Department (INDEPENDENT_AMBULATORY_CARE_PROVIDER_SITE_OTHER): Payer: Medicare Other

## 2011-04-17 DIAGNOSIS — M7989 Other specified soft tissue disorders: Secondary | ICD-10-CM

## 2011-04-17 DIAGNOSIS — L539 Erythematous condition, unspecified: Secondary | ICD-10-CM

## 2011-04-17 MED ORDER — LEVOFLOXACIN 500 MG PO TABS: 500.0000 mg | ORAL_TABLET | Freq: Every day | ORAL | Status: AC

## 2011-04-17 MED ORDER — MUPIROCIN CALCIUM 2 % EX CREA
TOPICAL_CREAM | Freq: Once | CUTANEOUS | Status: AC
  Administered 2011-04-17: 02:00:00 via TOPICAL

## 2011-04-17 NOTE — ED Provider Notes (Signed)
History     CSN: [id number] Arrival date & time: 04/16/2011 11:43 PM   First MD Initiated Contact with Patient 04/17/11 0056      Chief Complaint  Patient presents with  . 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 She states she has hardware in her left great toe with resultant limited range of motion. She is here for 1-1/2 weeks of redness and swelling of the left great toe with a wound along the base of the left great toe laterally. This wound has been oozing yellow drainage. The toe has not felt hot. She is having minimal pain. She denies systemic symptoms such as fever, chills. She states she's had cellulitis of that toe in the past. The symptoms are milder than her previous episode of cellulitis.  Past Medical History  Diagnosis Date  . CVA (cerebral vascular accident)     Left hemiparesis, at the time of critical illness in the past  . ARDS (adult respiratory distress syndrome)   . DIC (disseminated intravascular coagulation)   . Anxiety   . Depression   . Hypothyroid   . Atrial fibrillation     Paroxysmal, at times is severe illness in the past  . S/P IVC filter     Placed 2000 with severe illness  . Mitral regurgitation     Mild  . Chest pain     Nuclear, 2003, no ischemia  . Ejection fraction     EF 60%, echo, December, 2008  . Diastolic dysfunction   . Aortic insufficiency     Mild, echo, 2008  . Foot deformity     Congenitally abnormal left foot, multiple operations over time  . 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 Appendectomy   . Tonsillectomy   . Foot surgery     left foot multiple surgeries    History reviewed. No pertinent family history.  History  Substance  Use Topics  . Smoking status: Former Games developer  . Smokeless tobacco: Not on file  . Alcohol Use: Yes     occasional glass of wine    OB History    Grav Para Term Preterm Abortions TAB SAB Ect Mult Living                  Review of Systems  All other systems reviewed and are negative.    Allergies  Review of patient's allergies indicates no known allergies.  Home Medications   Current Outpatient Rx  Name Route Sig Dispense Refill  . ALENDRONATE SODIUM 70 MG PO TABS Oral Take 70 mg by mouth every 7 (seven) days. Take with a full glass of water on an empty stomach.     Marland Kitchen CALCIUM-VITAMIN D 500-200 MG-UNIT PO TABS Oral Take 1 tablet by mouth daily.     . CELECOXIB 200 MG PO CAPS Oral Take 200 mg by mouth daily.     Marland Kitchen CLONAZEPAM 1 MG PO TABS Oral Take 1 mg by mouth 4 (four) times daily as needed. For anxiety    . LAMOTRIGINE 150 MG PO TABS Oral Take 150 mg by mouth daily.     Marland Kitchen LEVOTHYROXINE SODIUM 75 MCG PO TABS Oral Take 75 mcg by mouth daily.     Marland Kitchen MIRTAZAPINE 15  MG PO TABS Oral Take 7.5 mg by mouth at bedtime.      Marland Kitchen MIRTAZAPINE 7.5 MG PO TABS Oral Take 7.5 mg by mouth at bedtime. sleep    . POLYETHYLENE GLYCOL 3350 PO Oral Take by mouth. As directed     . POLYETHYLENE GLYCOL 3350 PO POWD Oral Take 8 g by mouth daily.     Marland Kitchen VITAMIN D (ERGOCALCIFEROL) 50000 UNITS PO CAPS Oral Take 50,000 Units by mouth every 7 (seven) days. First dose today     . ZOLPIDEM TARTRATE ER 6.25 MG PO TBCR Oral Take 3.125 mg by mouth at bedtime as needed. For sleep      BP 88/49  Pulse 76  Temp(Src) 98.6 F (37 C) (Oral)  Resp 16  Ht 5\' 6"  (1.676 m)  Wt 108 lb (48.988 kg)  BMI 17.43 kg/m2  SpO2 97%  Physical Exam General: Well-developed, well-nourished female in no acute distress; appearance consistent with age of record HENT: normocephalic, atraumatic Eyes: pupils equal round and reactive to light; extraocular muscles intact; arcus senilis bilateral Neck: supple Heart: regular rate and  rhythm Lungs: clear to auscultation bilaterally Abdomen: soft; nontender; nondistended; no masses or hepatosplenomegaly; bowel sounds present Extremities: Surgical changes to left foot with decreased range of motion of left great toe; left great toe erythematous and edematous, crusted fissure present along medial aspect of the base of left great toe; left great toe nontender to palpation and without warmth or lymphangitis; trace edema of left ankle; right lower extremity unremarkable; pulses normal Lymphatic: No inguinal lymphadenopathy Neurologic: Awake, alert and oriented; motor function intact in all extremities; no facial droop Skin: Warm and dry    ED Course  Procedures (including critical care time)    MDM   Nursing notes and vitals signs, including pulse oximetry, reviewed.  Summary of this visit's results, reviewed by myself:  Labs:  Results for orders placed during the hospital encounter of 03/07/11  WOUND CULTURE      Component Value Range   Specimen Description TOE     Special Requests NONE     Gram Stain       Value: NO WBC SEEN     NO SQUAMOUS EPITHELIAL CELLS SEEN     NO ORGANISMS SEEN   Culture FEW STAPHYLOCOCCUS SPECIES (COAGULASE NEGATIVE)     Report Status 03/09/2011 FINAL    CBC      Component Value Range   WBC 7.0  4.0 - 10.5 (K/uL)   RBC 4.22  3.87 - 5.11 (MIL/uL)   Hemoglobin 12.9  12.0 - 15.0 (g/dL)   HCT 32.4  40.1 - 02.7 (%)   MCV 89.6  78.0 - 100.0 (fL)   MCH 30.6  26.0 - 34.0 (pg)   MCHC 34.1  30.0 - 36.0 (g/dL)   RDW 25.3  66.4 - 40.3 (%)   Platelets 273  150 - 400 (K/uL)  DIFFERENTIAL      Component Value Range   Neutrophils Relative 56  43 - 77 (%)   Neutro Abs 3.9  1.7 - 7.7 (K/uL)   Lymphocytes Relative 34  12 - 46 (%)   Lymphs Abs 2.4  0.7 - 4.0 (K/uL)   Monocytes Relative 8  3 - 12 (%)   Monocytes Absolute 0.6  0.1 - 1.0 (K/uL)   Eosinophils Relative 1  0 - 5 (%)   Eosinophils Absolute 0.1  0.0 - 0.7 (K/uL)   Basophils Relative 0   0 - 1 (%)  Basophils Absolute 0.0  0.0 - 0.1 (K/uL)  BASIC METABOLIC PANEL      Component Value Range   Sodium 143  135 - 145 (mEq/L)   Potassium 3.9  3.5 - 5.1 (mEq/L)   Chloride 107  96 - 112 (mEq/L)   CO2 27  19 - 32 (mEq/L)   Glucose, Bld 100 (*) 70 - 99 (mg/dL)   BUN 17  6 - 23 (mg/dL)   Creatinine, Ser 1.19  0.50 - 1.10 (mg/dL)   Calcium 9.3  8.4 - 14.7 (mg/dL)   GFR calc non Af Amer 87 (*) >90 (mL/min)   GFR calc Af Amer >90  >90 (mL/min)  SEDIMENTATION RATE      Component Value Range   Sed Rate 4  0 - 22 (mm/hr)  C-REACTIVE PROTEIN      Component Value Range   CRP 0.08 (*) <0.60 (mg/dL)    Imaging Studies: Dg Foot Complete Left  04/17/2011  *RADIOLOGY REPORT*  Clinical Data: Swelling and erythema of the great toe.  LEFT FOOT - COMPLETE 3+ VIEW  Comparison: Multiple prior radiographs of the foot and great toe ranging from 11/28/2010 through 03/07/2011.  Findings: Extensive postsurgical changes are stable status post plate and screw arthrodesis across the first metatarsal phalangeal joint.  A screw traverses the medial cuneiform/first metatarsal articulation.  There are four metallic staples for mid foot and hind foot arthrodesis.  No hardware loosening is identified.  Erosive changes at the nonfused metatarsal phalangeal joints are unchanged.  There is also some erosive change at the second proximal interphalangeal joint.  Some of the other interphalangeal joints appear fused. 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  There is no evidence of acute fracture, dislocation or bone destruction. Chronic postsurgical defect in the distal tibia is unchanged.  IMPRESSION: Stable postsurgical and arthropathic changes in the left foot.  No acute osseous findings identified.  Original Report Authenticated By: Gerrianne Scale, M.D.    We'll treat for cellulitis and refer back to Dr. Lestine Box.        Hanley Seamen, MD 04/17/11 0201

## 2011-04-17 NOTE — ED Notes (Signed)
Wound cleaned and bacitracin applied. Pt alert but confused at times and talking in circles. [facility] called for transport.

## 2011-05-27 ENCOUNTER — Emergency Department (HOSPITAL_BASED_OUTPATIENT_CLINIC_OR_DEPARTMENT_OTHER)
Admission: EM | Admit: 2011-05-27 | Discharge: 2011-05-27 | Disposition: A | Discharge status: Home / Self Care | Payer: Medicare Other | Attending: Emergency Medicine | Admitting: Emergency Medicine

## 2011-05-27 ENCOUNTER — Emergency Department (INDEPENDENT_AMBULATORY_CARE_PROVIDER_SITE_OTHER): Payer: Medicare Other

## 2011-05-27 ENCOUNTER — Encounter (HOSPITAL_BASED_OUTPATIENT_CLINIC_OR_DEPARTMENT_OTHER): Payer: Self-pay | Admitting: *Deleted

## 2011-05-27 DIAGNOSIS — E039 Hypothyroidism, unspecified: Secondary | ICD-10-CM | POA: Insufficient documentation

## 2011-05-27 DIAGNOSIS — J029 Acute pharyngitis, unspecified: Secondary | ICD-10-CM

## 2011-05-27 DIAGNOSIS — R5381 Other malaise: Secondary | ICD-10-CM

## 2011-05-27 DIAGNOSIS — IMO0001 Reserved for inherently not codable concepts without codable children: Secondary | ICD-10-CM

## 2011-05-27 DIAGNOSIS — F341 Dysthymic disorder: Secondary | ICD-10-CM | POA: Insufficient documentation

## 2011-05-27 DIAGNOSIS — Z8679 Personal history of other diseases of the circulatory system: Secondary | ICD-10-CM | POA: Insufficient documentation

## 2011-05-27 DIAGNOSIS — Z79899 Other long term (current) drug therapy: Secondary | ICD-10-CM | POA: Insufficient documentation

## 2011-05-27 LAB — URINALYSIS, ROUTINE W REFLEX MICROSCOPIC
Glucose, UA: NEGATIVE mg/dL
Hgb urine dipstick: NEGATIVE
Ketones, ur: NEGATIVE mg/dL
Protein, ur: NEGATIVE mg/dL
Urobilinogen, UA: 0.2 mg/dL (ref 0.0–1.0)

## 2011-05-27 NOTE — ED Notes (Signed)
Assisted patient to car with wheelchair

## 2011-05-27 NOTE — ED Notes (Signed)
Patient c/o generalized body aches, no change from baseline per patient in muscle strength, no difficulty swallowing, drinking fluids, education given on hydration, eating small meals throughout the day

## 2011-05-27 NOTE — ED Provider Notes (Signed)
History     CSN: [id number]  Arrival date & time 05/27/11  1217   First MD Initiated Contact with Patient 05/27/11 1318      Chief Complaint  Patient presents with  . Sore Throat    (Consider location/radiation/quality/duration/timing/severity/associated sxs/prior treatment) Patient is a 71 y.o. female presenting with pharyngitis. The history is provided by the patient. No language interpreter was used.  Sore Throat This is a new problem. The current episode started in the past 7 days. The problem occurs constantly. The problem has been unchanged. Associated symptoms include chills, coughing and myalgias. Pertinent negatives include no abdominal pain or fever. The symptoms are aggravated by nothing. She has tried nothing for the symptoms.    Past Medical History  Diagnosis Date  . CVA (cerebral vascular accident)     Left hemiparesis, at the time of critical illness in the past  . ARDS (adult respiratory distress syndrome)   . DIC (disseminated intravascular coagulation)   . Anxiety   . Depression   . Hypothyroid   . Atrial fibrillation     Paroxysmal, at times is severe illness in the past  . S/P IVC filter     Placed 2000 with severe illness  . Mitral regurgitation     Mild  . Chest pain     Nuclear, 2003, no ischemia  . Ejection fraction     EF 60%, echo, December, 2008  . Diastolic dysfunction   . Aortic insufficiency     Mild, echo, 2008  . Foot deformity     Congenitally abnormal left foot, multiple operations over time  . 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 Appendectomy   . Tonsillectomy   . Foot surgery     left foot multiple surgeries  . Bladder tack     No family history on file.  History  Substance Use Topics  . Smoking status: Former Games developer  . Smokeless tobacco: Never Used  . Alcohol Use: Yes     occasional glass of wine    OB History    Grav Para Term  Preterm Abortions TAB SAB Ect Mult Living                  Review of Systems  Constitutional: Positive for chills. Negative for fever.  Respiratory: Positive for cough.   Gastrointestinal: Negative for abdominal pain.  Musculoskeletal: Positive for myalgias.  All other systems reviewed and are negative.    Allergies  Review of patient's allergies indicates no known allergies.  Home Medications   Current Outpatient Rx  Name Route Sig Dispense Refill  . ALENDRONATE SODIUM 70 MG PO TABS Oral Take 70 mg by mouth every 7 (seven) days. Take with a full glass of water on an empty stomach.     Marland Kitchen CALCIUM-VITAMIN D 500-200 MG-UNIT PO TABS Oral Take 1 tablet by mouth daily.     . CELECOXIB 200 MG PO CAPS Oral Take 200 mg by mouth daily.     Marland Kitchen CLONAZEPAM 1 MG PO TABS Oral Take 1 mg by mouth 4 (four) times daily as needed. For anxiety    . LAMOTRIGINE 150 MG PO TABS Oral Take 150 mg by mouth daily.     Marland Kitchen LEVOTHYROXINE SODIUM 75 MCG PO TABS Oral Take 75 mcg by mouth daily.     Marland Kitchen MIRTAZAPINE 15 MG PO TABS Oral Take  7.5 mg by mouth at bedtime.      Marland Kitchen MIRTAZAPINE 7.5 MG PO TABS Oral Take 7.5 mg by mouth at bedtime. sleep    . POLYETHYLENE GLYCOL 3350 PO Oral Take by mouth. As directed     . POLYETHYLENE GLYCOL 3350 PO POWD Oral Take 8 g by mouth daily.     Marland Kitchen VITAMIN D (ERGOCALCIFEROL) 50000 UNITS PO CAPS Oral Take 50,000 Units by mouth every 7 (seven) days. First dose today     . ZOLPIDEM TARTRATE ER 6.25 MG PO TBCR Oral Take 3.125 mg by mouth at bedtime as needed. For sleep      BP 126/72  Pulse 78  Temp(Src) 98.4 F (36.9 C) (Oral)  Resp 18  Ht 5\' 6"  (1.676 m)  Wt 115 lb (52.164 kg)  BMI 18.56 kg/m2  SpO2 97%  Physical Exam  Nursing note and vitals reviewed. Constitutional: She is oriented to person, place, and time. She appears well-developed and well-nourished.  HENT:  Head: Normocephalic and atraumatic.  Right Ear: External ear normal.  Left Ear: External ear normal.    Mouth/Throat: Oropharynx is clear and moist.  Eyes: Conjunctivae and EOM are normal.  Neck: Normal range of motion. Neck supple.  Cardiovascular: Normal rate and regular rhythm.   Pulmonary/Chest: Effort normal and breath sounds normal.  Abdominal: Soft.  Musculoskeletal: Normal range of motion.  Neurological: She is alert and oriented to person, place, and time.       Pt has left sided weakness  Skin: Skin is warm and dry.  Psychiatric: She has a normal mood and affect.    ED Course  Procedures (including critical care time)  Labs Reviewed  URINALYSIS, ROUTINE W REFLEX MICROSCOPIC - Abnormal; Notable for the following:    Leukocytes, UA TRACE (*)    All other components within normal limits  URINE MICROSCOPIC-ADD ON - Abnormal; Notable for the following:    Bacteria, UA FEW (*)    All other components within normal limits   Dg Chest 2 View  05/27/2011  *RADIOLOGY REPORT*  Clinical Data: Weakness.  CHEST - 2 VIEW  Comparison: Plain film chest 06/30/2007, 05/30/2010 and 10/27/2010.  Findings: Lungs are clear.  No pneumothorax or effusion.  Heart size normal.  No focal bony abnormality.  IVC filter is noted.  IMPRESSION: No acute finding.  Original Report Authenticated By: Bernadene Bell. D'ALESSIO, M.D.     1. Pharyngitis       MDM  On further discussion with pt she states that she doesn't have any good friends and that she is sad a lot:pt denies si/hi:pt states that she had tried antidepressants in the past without relief:pharyngitis is likely viral        Teressa Lower, NP 05/27/11 1520

## 2011-05-27 NOTE — ED Notes (Signed)
Sore throat x 3 days- "raspy throat"- chills- denies cough

## 2011-05-29 NOTE — ED Provider Notes (Signed)
Medical screening examination/treatment/procedure(s) were performed by non-physician practitioner and as supervising physician I was immediately available for consultation/collaboration.  Aidyn Kellis R. Janthony Holleman, MD 05/29/11 1457 

## 2011-08-09 ENCOUNTER — Emergency Department (HOSPITAL_BASED_OUTPATIENT_CLINIC_OR_DEPARTMENT_OTHER)
Admission: EM | Admit: 2011-08-09 | Discharge: 2011-08-09 | Disposition: A | Discharge status: Home / Self Care | Payer: Medicare Other | Attending: Emergency Medicine | Admitting: Emergency Medicine

## 2011-08-09 ENCOUNTER — Encounter (HOSPITAL_BASED_OUTPATIENT_CLINIC_OR_DEPARTMENT_OTHER): Payer: Self-pay | Admitting: *Deleted

## 2011-08-09 DIAGNOSIS — F3289 Other specified depressive episodes: Secondary | ICD-10-CM | POA: Insufficient documentation

## 2011-08-09 DIAGNOSIS — Z79899 Other long term (current) drug therapy: Secondary | ICD-10-CM | POA: Insufficient documentation

## 2011-08-09 DIAGNOSIS — Z8673 Personal history of transient ischemic attack (TIA), and cerebral infarction without residual deficits: Secondary | ICD-10-CM | POA: Insufficient documentation

## 2011-08-09 DIAGNOSIS — I4891 Unspecified atrial fibrillation: Secondary | ICD-10-CM | POA: Insufficient documentation

## 2011-08-09 DIAGNOSIS — M79609 Pain in unspecified limb: Secondary | ICD-10-CM | POA: Insufficient documentation

## 2011-08-09 DIAGNOSIS — I059 Rheumatic mitral valve disease, unspecified: Secondary | ICD-10-CM | POA: Insufficient documentation

## 2011-08-09 DIAGNOSIS — M79675 Pain in left toe(s): Secondary | ICD-10-CM

## 2011-08-09 DIAGNOSIS — S98139A Complete traumatic amputation of one unspecified lesser toe, initial encounter: Secondary | ICD-10-CM | POA: Insufficient documentation

## 2011-08-09 DIAGNOSIS — F329 Major depressive disorder, single episode, unspecified: Secondary | ICD-10-CM | POA: Insufficient documentation

## 2011-08-09 DIAGNOSIS — E039 Hypothyroidism, unspecified: Secondary | ICD-10-CM | POA: Insufficient documentation

## 2011-08-09 DIAGNOSIS — F411 Generalized anxiety disorder: Secondary | ICD-10-CM | POA: Insufficient documentation

## 2011-08-09 DIAGNOSIS — Z87891 Personal history of nicotine dependence: Secondary | ICD-10-CM | POA: Insufficient documentation

## 2011-08-09 MED ORDER — SULFAMETHOXAZOLE-TRIMETHOPRIM 800-160 MG PO TABS: 2.0000 | ORAL_TABLET | Freq: Two times a day (BID) | ORAL | Status: AC

## 2011-08-09 NOTE — ED Notes (Signed)
To treatment room via wheelchair. Deformity of pts left foot. Pt states it is from birth. Multiple surgeries in [place].  Small amount of thick drainage noted when she pushes on her toe.

## 2011-08-09 NOTE — ED Notes (Signed)
Drainage from her left toe x 4 days. Red, painful and draining. States she has had problems since birth with her foot and has numerous surgeries. Has been using neosporin without improvement. Drove herself here.

## 2011-08-09 NOTE — ED Provider Notes (Signed)
History     CSN: [id number]  Arrival date & time 08/09/11  1058   First MD Initiated Contact with Patient 08/09/11 1112      Chief Complaint  Patient presents with  . Wound Infection    (Consider location/radiation/quality/duration/timing/severity/associated sxs/prior treatment) HPI Patient is an anxious 71 yo female who has numerous presentations for possible infection of her left foot and presents for same today.  Patient had surgery on her left great toe many years ago and has deformity of this.  She also has had deformity of her left second toe and has had development of cellulitis in the past between the two toes.  She also frequently has reported yellowish clear drainage from this area in the past and does so again today.  She denies injury, pain, fevers, nausea, or vomiting.  The foot is not red here which patient agrees with and the area of concern has no fluctuance, erythema, or drainage on extensive palpation.  Patient reports she had redness and drainage there when she woke which is now gone.  There are no other associated or modifying factors.  Past Medical History  Diagnosis Date  . CVA (cerebral vascular accident)     Left hemiparesis, at the time of critical illness in the past  . ARDS (adult respiratory distress syndrome)   . DIC (disseminated intravascular coagulation)   . Anxiety   . Depression   . Hypothyroid   . Atrial fibrillation     Paroxysmal, at times is severe illness in the past  . S/P IVC filter     Placed 2000 with severe illness  . Mitral regurgitation     Mild  . Chest pain     Nuclear, 2003, no ischemia  . Ejection fraction     EF 60%, echo, December, 2008  . Diastolic dysfunction   . Aortic insufficiency     Mild, echo, 2008  . Foot deformity     Congenitally abnormal left foot, multiple operations over time  . 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  Appendectomy   . Tonsillectomy   . Foot surgery     left foot multiple surgeries  . Bladder tack     History reviewed. No pertinent family history.  History  Substance Use Topics  . Smoking status: Former Games developer  . Smokeless tobacco: Never Used  . Alcohol Use: Yes     occasional glass of wine    OB History    Grav Para Term Preterm Abortions TAB SAB Ect Mult Living                  Review of Systems  Constitutional: Negative.   HENT: Negative.   Eyes: Negative.   Respiratory: Negative.   Cardiovascular: Negative.   Gastrointestinal: Negative.   Genitourinary: Negative.   Musculoskeletal: Negative.   Skin: Positive for rash and wound.  Neurological: Negative.   Hematological: Negative.   Psychiatric/Behavioral: Negative.   All other systems reviewed and are negative.    Allergies  Review of patient's allergies indicates no known allergies.  Home Medications   Current Outpatient Rx  Name Route Sig Dispense Refill  . ALENDRONATE SODIUM 70 MG PO TABS Oral Take 70 mg by mouth every 7 (seven) days. Take with a full glass of water on an empty stomach.     Marland Kitchen CALCIUM-VITAMIN D 500-200 MG-UNIT PO TABS  Oral Take 1 tablet by mouth daily.     . CELECOXIB 200 MG PO CAPS Oral Take 200 mg by mouth daily.     Marland Kitchen CLONAZEPAM 1 MG PO TABS Oral Take 1 mg by mouth 4 (four) times daily as needed. For anxiety    . LAMOTRIGINE 150 MG PO TABS Oral Take 150 mg by mouth daily.     Marland Kitchen LEVOTHYROXINE SODIUM 75 MCG PO TABS Oral Take 75 mcg by mouth daily.     Marland Kitchen MIRTAZAPINE 15 MG PO TABS Oral Take 7.5 mg by mouth at bedtime.      Marland Kitchen MIRTAZAPINE 7.5 MG PO TABS Oral Take 7.5 mg by mouth at bedtime. sleep    . POLYETHYLENE GLYCOL 3350 PO Oral Take by mouth. As directed     . POLYETHYLENE GLYCOL 3350 PO POWD Oral Take 8 g by mouth daily.     . SULFAMETHOXAZOLE-TRIMETHOPRIM 800-160 MG PO TABS Oral Take 2 tablets by mouth 2 (two) times daily. 28 tablet 0  . VITAMIN D (ERGOCALCIFEROL) 50000 UNITS PO  CAPS Oral Take 50,000 Units by mouth every 7 (seven) days. First dose today     . ZOLPIDEM TARTRATE ER 6.25 MG PO TBCR Oral Take 3.125 mg by mouth at bedtime as needed. For sleep      BP 103/57  Pulse 80  Temp(Src) 97.5 F (36.4 C) (Oral)  Resp 20  Wt 120 lb (54.432 kg)  SpO2 100%  Physical Exam  Nursing note and vitals reviewed. GEN: Well-developed, well-nourished female in no distress HEENT: Atraumatic, normocephalic.  EYES: PERRLA BL, no scleral icterus. NECK: Trachea midline, no meningismus CV: regular rate and rhythm.  PULM: No respiratory distress.   Neuro: cranial nerves grossly 2-12 intact, A and O x 3 MSK: Patient moves all 4 extremities symmetrically, no edema, or injury noted.  Patient with enlarged and deformed left great toe from prior surgery and subsequent abnormality of the left 2nd toe, this is baseline for this patient Skin: No rashes, petechiae, purpura, or jaundice.  Patient's area of concern is extensively examined without any abnormality noted. Psych: quite anxious ED Course  Procedures (including critical care time)  Labs Reviewed - No data to display No results found.   1. Toe pain, left       MDM  Patient was evaluated and had no signs of infection today.  We discussed this extensively.  This was the Thursday before good Friday and the patient was concerned about an infection developing over the weekend.  She agreed with me that the toe does not appear infected currently.  I was comfortable giving her a prescription for bactrim with cellulitis dosing to hold unless she developed signs of cellulitis.  She was discharged in good condition.        Cyndra Numbers, MD 08/10/11 [phone]

## 2011-11-17 ENCOUNTER — Encounter (HOSPITAL_BASED_OUTPATIENT_CLINIC_OR_DEPARTMENT_OTHER): Payer: Self-pay | Admitting: *Deleted

## 2011-11-17 ENCOUNTER — Emergency Department (HOSPITAL_BASED_OUTPATIENT_CLINIC_OR_DEPARTMENT_OTHER)
Admission: EM | Admit: 2011-11-17 | Discharge: 2011-11-17 | Disposition: A | Discharge status: Home / Self Care | Payer: Medicare Other | Attending: Emergency Medicine | Admitting: Emergency Medicine

## 2011-11-17 DIAGNOSIS — F3289 Other specified depressive episodes: Secondary | ICD-10-CM | POA: Insufficient documentation

## 2011-11-17 DIAGNOSIS — F329 Major depressive disorder, single episode, unspecified: Secondary | ICD-10-CM | POA: Insufficient documentation

## 2011-11-17 DIAGNOSIS — Z87891 Personal history of nicotine dependence: Secondary | ICD-10-CM | POA: Insufficient documentation

## 2011-11-17 DIAGNOSIS — L02619 Cutaneous abscess of unspecified foot: Secondary | ICD-10-CM | POA: Insufficient documentation

## 2011-11-17 DIAGNOSIS — E039 Hypothyroidism, unspecified: Secondary | ICD-10-CM | POA: Insufficient documentation

## 2011-11-17 DIAGNOSIS — L0390 Cellulitis, unspecified: Secondary | ICD-10-CM

## 2011-11-17 DIAGNOSIS — S98139A Complete traumatic amputation of one unspecified lesser toe, initial encounter: Secondary | ICD-10-CM | POA: Insufficient documentation

## 2011-11-17 DIAGNOSIS — Z9089 Acquired absence of other organs: Secondary | ICD-10-CM | POA: Insufficient documentation

## 2011-11-17 DIAGNOSIS — I4891 Unspecified atrial fibrillation: Secondary | ICD-10-CM | POA: Insufficient documentation

## 2011-11-17 DIAGNOSIS — F411 Generalized anxiety disorder: Secondary | ICD-10-CM | POA: Insufficient documentation

## 2011-11-17 MED ORDER — SULFAMETHOXAZOLE-TMP DS 800-160 MG PO TABS
1.0000 | ORAL_TABLET | Freq: Once | ORAL | Status: AC
  Administered 2011-11-17: 1 via ORAL
  Filled 2011-11-17: qty 1

## 2011-11-17 MED ORDER — SULFAMETHOXAZOLE-TRIMETHOPRIM 800-160 MG PO TABS: 1.0000 | ORAL_TABLET | Freq: Two times a day (BID) | ORAL | Status: AC

## 2011-11-17 NOTE — ED Notes (Signed)
Pt c/o left foot pain for a couple of weeks.

## 2011-11-17 NOTE — ED Provider Notes (Addendum)
History   This chart was scribed for Dana Sprout, MD by [name]. The patient was seen in room MH08/MH08 and the patient's care was started at 9:34 PM     CSN: [id number]  Arrival date & time 11/17/11  2106   First MD Initiated Contact with Patient 11/17/11 2126      Chief Complaint  Patient presents with  . Foot Pain    (Consider location/radiation/quality/duration/timing/severity/associated sxs/prior treatment) Patient is a 71 y.o. female presenting with lower extremity pain. The history is provided by the patient. No language interpreter was used.  Foot Pain This is a new problem. The current episode started more than 1 week ago. The problem occurs constantly. The problem has not changed since onset.Pertinent negatives include no abdominal pain and no shortness of breath. The symptoms are aggravated by standing and stress. Nothing relieves the symptoms. She has tried nothing for the symptoms. The treatment provided no relief.    [name] is a 71 y.o. female who presents to the Emergency Department complaining of moderate, episodic foot pain located at the left foot onset two weeks ago with associated symptoms of swelling, drainage. The pt informs the EDP that the foot does not hurt at present. The pt reports when she stands up, she has to be very careful to avoid any strain to left toe. Modifying factors include application of cotton pads which provide moderate relief. Pt has a hx of cellulitis, stroke, left foot reconstruction (toes, arches. Performed in [place], 2001).  Pt denies abnormal erythema, injury to foot, fever, allergies to any medications.    PCP is Dr. Fonnie Jarvis.     Past Medical History  Diagnosis Date  . CVA (cerebral vascular accident)     Left hemiparesis, at the time of critical illness in the past  . ARDS (adult respiratory distress syndrome)   . DIC (disseminated intravascular coagulation)   . Anxiety   . Depression   . Hypothyroid   . Atrial fibrillation      Paroxysmal, at times is severe illness in the past  . S/P IVC filter     Placed 2000 with severe illness  . Mitral regurgitation     Mild  . Chest pain     Nuclear, 2003, no ischemia  . Ejection fraction     EF 60%, echo, December, 2008  . Diastolic dysfunction   . Aortic insufficiency     Mild, echo, 2008  . Foot deformity     Congenitally abnormal left foot, multiple operations over time  . 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 Appendectomy   . Tonsillectomy   . Foot surgery     left foot multiple surgeries  . Bladder tack     History reviewed. No pertinent family history.  History  Substance Use Topics  . Smoking status: Former Games developer  . Smokeless tobacco: Never Used  . Alcohol Use: Yes     occasional glass of wine    OB History    Grav Para Term Preterm Abortions TAB SAB Ect Mult Living                  Review of Systems  Respiratory: Negative for shortness of breath.   Gastrointestinal: Negative for abdominal pain.  All other systems reviewed and are negative.    Allergies  Review of patient's allergies indicates no known allergies.  Home Medications   Current Outpatient Rx  Name Route Sig Dispense Refill  . ALENDRONATE SODIUM 70 MG PO TABS Oral Take 70 mg by mouth every 7 (seven) days. Take with a full glass of water on an empty stomach.     Marland Kitchen CALCIUM-VITAMIN D 500-200 MG-UNIT PO TABS Oral Take 1 tablet by mouth daily.     . CELECOXIB 200 MG PO CAPS Oral Take 200 mg by mouth daily.     Marland Kitchen CLONAZEPAM 1 MG PO TABS Oral Take 1 mg by mouth 4 (four) times daily as needed. For anxiety    . LAMOTRIGINE 150 MG PO TABS Oral Take 150 mg by mouth daily.     Marland Kitchen LEVOTHYROXINE SODIUM 75 MCG PO TABS Oral Take 75 mcg by mouth daily.     Marland Kitchen MIRTAZAPINE 15 MG PO TABS Oral Take 7.5 mg by mouth at bedtime.      Marland Kitchen MIRTAZAPINE 7.5 MG PO TABS Oral Take 7.5 mg by mouth at bedtime. sleep      . POLYETHYLENE GLYCOL 3350 PO Oral Take by mouth. As directed     . POLYETHYLENE GLYCOL 3350 PO POWD Oral Take 8 g by mouth daily.     Marland Kitchen VITAMIN D (ERGOCALCIFEROL) 50000 UNITS PO CAPS Oral Take 50,000 Units by mouth every 7 (seven) days. First dose today     . ZOLPIDEM TARTRATE ER 6.25 MG PO TBCR Oral Take 3.125 mg by mouth at bedtime as needed. For sleep      BP 118/62  Pulse 84  Temp 97.6 F (36.4 C) (Oral)  Resp 20  Ht 5\' 5"  (1.651 m)  Wt 112 lb (50.803 kg)  BMI 18.64 kg/m2  SpO2 98%  Physical Exam  Nursing note and vitals reviewed. Constitutional: She is oriented to person, place, and time. She appears well-developed and well-nourished.  HENT:  Head: Atraumatic.  Right Ear: External ear normal.  Left Ear: External ear normal.  Nose: Nose normal.  Musculoskeletal:       Feet:       Multiple Surgical scars of the left foot.   Neurological: She is alert and oriented to person, place, and time.  Skin: Skin is warm and dry.  Psychiatric: She has a normal mood and affect. Her behavior is normal.    ED Course  Procedures (including critical care time)  DIAGNOSTIC STUDIES: Oxygen Saturation is 98% on room air, normal by my interpretation.    COORDINATION OF CARE:    9:37PM- EDP at bedside discusses treatment plan concerning antibiotics, evaluation of x-ray.  Labs Reviewed - No data to display No results found.   1. Cellulitis   2. Wound cellulitis       MDM   Patient with a left foot deformity and multiple surgical scars with a history of a complicated foot repair in 2001. She presents today do to mild redness and wound on the foot. Patient has a small open area along her surgical scar and warmth and mild redness of the foot. She states the redness is normal but the warmth and small open areas not. She states this happened several months ago and was given an antibiotic that made the symptoms resolve.    Looking back patient was given Bactrim. Patient was  represcribed Bactrim and discharged home to followup with her orthopedist for reevaluation in 2 days.      I personally performed the services described in this documentation, which was scribed in my presence.  The recorded information  has been reviewed and considered.   Dana Sprout, MD 11/17/11 1610  Dana Sprout, MD 11/17/11 2148

## 2012-02-01 ENCOUNTER — Other Ambulatory Visit: Payer: Self-pay | Admitting: Physician Assistant

## 2012-02-04 ENCOUNTER — Ambulatory Visit: Payer: Medicare Other | Admitting: Cardiology

## 2012-02-11 ENCOUNTER — Encounter: Payer: Self-pay | Admitting: Cardiology

## 2012-02-11 ENCOUNTER — Ambulatory Visit (INDEPENDENT_AMBULATORY_CARE_PROVIDER_SITE_OTHER): Payer: Medicare Other | Admitting: Cardiology

## 2012-02-11 ENCOUNTER — Ambulatory Visit: Payer: Medicare Other | Admitting: Cardiology

## 2012-02-11 VITALS — BP 104/68 | HR 89 | Ht 66.0 in | Wt 111.0 lb

## 2012-02-11 DIAGNOSIS — I4891 Unspecified atrial fibrillation: Secondary | ICD-10-CM

## 2012-02-11 DIAGNOSIS — I340 Nonrheumatic mitral (valve) insufficiency: Secondary | ICD-10-CM

## 2012-02-11 DIAGNOSIS — I5189 Other ill-defined heart diseases: Secondary | ICD-10-CM

## 2012-02-11 DIAGNOSIS — I519 Heart disease, unspecified: Secondary | ICD-10-CM

## 2012-02-11 DIAGNOSIS — I059 Rheumatic mitral valve disease, unspecified: Secondary | ICD-10-CM

## 2012-02-11 NOTE — Assessment & Plan Note (Signed)
She had isolated atrial fibrillation in the past. She's not had any recurring symptoms. No further workup.

## 2012-02-11 NOTE — Assessment & Plan Note (Signed)
There is history of some diastolic dysfunction. She is not having any signs of CHF.

## 2012-02-11 NOTE — Assessment & Plan Note (Signed)
Patient is not having any recurrent chest pain. No further workup.

## 2012-02-11 NOTE — Patient Instructions (Addendum)
Your physician recommends that you continue on your current medications as directed. Please refer to the Current Medication list given to you today.  Your physician wants you to follow-up in: 1 year. You will receive a reminder letter in the mail two months in advance. If you don't receive a letter, please call our office to schedule the follow-up appointment.  

## 2012-02-11 NOTE — Progress Notes (Signed)
HPI   The patient is seen to followup paroxysmal atrial fibrillation. She is doing well. I saw her last September, 2012. The patient had a severe illness in 2000. This required an IVC filter. Ultimately she stabilized. From the cardiac viewpoint her only issue was atrial fibrillation. This also stabilized. More recently in 2003 she had some chest discomfort. Her workup did not show any concern for ischemia. She's had mild aortic insufficiency and mild mitral regurgitation. Her ejection fraction has been normal. She's here today in stable. She's not having any chest pain or shortness of breath.  No Known Allergies  Current Outpatient Prescriptions  Medication Sig Dispense Refill  . alendronate (FOSAMAX) 70 MG tablet Take 70 mg by mouth every 7 (seven) days. Take with a full glass of water on an empty stomach.       . Calcium Carbonate-Vitamin D (CALCIUM-VITAMIN D) 500-200 MG-UNIT per tablet Take 1 tablet by mouth daily.       . clonazePAM (KLONOPIN) 1 MG tablet Take 1 mg by mouth 4 (four) times daily as needed. For anxiety      . gabapentin (NEURONTIN) 100 MG capsule Take 100 mg by mouth.       . levothyroxine (SYNTHROID, LEVOTHROID) 75 MCG tablet Take 75 mcg by mouth daily.       Marland Kitchen omeprazole (PRILOSEC) 20 MG capsule Take 20 mg by mouth daily.       . Sennosides (SENNA LAX PO) Take by mouth as directed.      . Vitamin D, Ergocalciferol, (DRISDOL) 50000 UNITS CAPS Take 50,000 Units by mouth every 7 (seven) days. First dose today       . zolpidem (AMBIEN CR) 6.25 MG CR tablet Take 3.125 mg by mouth at bedtime as needed. For sleep        History   Social History  . Marital Status: Divorced    Spouse Name: N/A    Number of Children: N/A  . Years of Education: N/A   Occupational History  . Not on file.   Social History Main Topics  . Smoking status: Former Games developer  . Smokeless tobacco: Never Used  . Alcohol Use: Yes     occasional glass of wine  . Drug Use: No  . Sexually Active:     Other Topics Concern  . Not on file   Social History Narrative  . No narrative on file    History reviewed. No pertinent family history.  Past Medical History  Diagnosis Date  . CVA (cerebral vascular accident)     Left hemiparesis, at the time of critical illness in the past  . ARDS (adult respiratory distress syndrome)   . DIC (disseminated intravascular coagulation)   . Anxiety   . Depression   . Hypothyroid   . Atrial fibrillation     Paroxysmal, at times is severe illness in the past  . S/P IVC filter     Placed 2000 with severe illness  . Mitral regurgitation     Mild  . Chest pain     Nuclear, 2003, no ischemia  . Ejection fraction     EF 60%, echo, December, 2008  . Diastolic dysfunction   . Aortic insufficiency     Mild, echo, 2008  . Foot deformity     Congenitally abnormal left foot, multiple operations over time  . 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 Appendectomy   . Tonsillectomy   . Foot surgery     left foot multiple surgeries  . Bladder tack     ROS   Patient denies fever or, chills, headache, rash, change in vision, change in hearing, chest pain, cough, nausea vomiting, urinary symptoms. All other systems are reviewed and are negative.  PHYSICAL EXAM  Patient is here with her daughter. She is stable. She walks with a walker from the CVA that she had back in 2000. She is oriented to person time and place. Affect is normal. She is frail. There is no jugulovenous distention. There no carotid bruits. Lungs are clear. Respiratory effort is nonlabored. Cardiac exam reveals S1 and S2. I do not hear significant AI or significant MR. Abdomen is soft. There is no peripheral edema.  Filed Vitals:   02/11/12 1459  BP: 104/68  Pulse: 89  Height: 5\' 6"  (1.676 m)  Weight: 111 lb (50.349 kg)   EKG is done and reviewed by me. It is normal.  ASSESSMENT & PLAN

## 2012-02-11 NOTE — Assessment & Plan Note (Signed)
The patient has had mild valvular disease. I do not hear significant murmurs at this time. I've chosen not to do an echo at this point. See her back in one year.

## 2012-02-14 ENCOUNTER — Other Ambulatory Visit: Payer: Self-pay | Admitting: Physician Assistant

## 2012-02-14 NOTE — Telephone Encounter (Signed)
Please pull paper chart.  

## 2012-02-15 NOTE — Telephone Encounter (Signed)
Chart pulled to PA pool at nurses station [phone]

## 2012-02-15 NOTE — Telephone Encounter (Signed)
The patient hasn't been here since we went live on [facility] 06/12/2011.  Need paper record for review. 

## 2012-02-27 ENCOUNTER — Emergency Department (HOSPITAL_COMMUNITY)
Admission: EM | Admit: 2012-02-27 | Discharge: 2012-02-28 | Disposition: A | Discharge status: Nursing Home (Includes ICF) | Payer: Medicare Other | Attending: Emergency Medicine | Admitting: Emergency Medicine

## 2012-02-27 ENCOUNTER — Emergency Department (HOSPITAL_COMMUNITY): Payer: Medicare Other

## 2012-02-27 ENCOUNTER — Encounter (HOSPITAL_COMMUNITY): Payer: Self-pay | Admitting: *Deleted

## 2012-02-27 DIAGNOSIS — I4891 Unspecified atrial fibrillation: Secondary | ICD-10-CM | POA: Insufficient documentation

## 2012-02-27 DIAGNOSIS — E039 Hypothyroidism, unspecified: Secondary | ICD-10-CM | POA: Insufficient documentation

## 2012-02-27 DIAGNOSIS — S98139A Complete traumatic amputation of one unspecified lesser toe, initial encounter: Secondary | ICD-10-CM | POA: Insufficient documentation

## 2012-02-27 DIAGNOSIS — R609 Edema, unspecified: Secondary | ICD-10-CM | POA: Insufficient documentation

## 2012-02-27 DIAGNOSIS — Z8673 Personal history of transient ischemic attack (TIA), and cerebral infarction without residual deficits: Secondary | ICD-10-CM | POA: Insufficient documentation

## 2012-02-27 DIAGNOSIS — M79673 Pain in unspecified foot: Secondary | ICD-10-CM

## 2012-02-27 DIAGNOSIS — F411 Generalized anxiety disorder: Secondary | ICD-10-CM | POA: Insufficient documentation

## 2012-02-27 DIAGNOSIS — Z87891 Personal history of nicotine dependence: Secondary | ICD-10-CM | POA: Insufficient documentation

## 2012-02-27 DIAGNOSIS — L539 Erythematous condition, unspecified: Secondary | ICD-10-CM | POA: Insufficient documentation

## 2012-02-27 LAB — CBC
HCT: 35.5 % — ABNORMAL LOW (ref 36.0–46.0)
MCH: 30.6 pg (ref 26.0–34.0)
MCV: 89.9 fL (ref 78.0–100.0)
RBC: 3.95 MIL/uL (ref 3.87–5.11)
WBC: 7.1 10*3/uL (ref 4.0–10.5)

## 2012-02-27 NOTE — ED Notes (Signed)
Pt reports left foot pain off and on x 2 weeks; c/o increase in swelling today and difficulty walking due to swelling to great toe; hx of multiple surgeries to left foot; hx of CVA with left sided weakness

## 2012-02-27 NOTE — ED Notes (Signed)
[id number]<AV> Expected date:<BR> Expected time:<BR> Means of arrival:<BR> Comments:<BR> EMS/70 yo with foot pain and swelling

## 2012-02-27 NOTE — ED Provider Notes (Signed)
History     CSN: [id number]  Arrival date & time 02/27/12  1949   First MD Initiated Contact with Patient 02/27/12 2127      Chief Complaint  Patient presents with  . Foot Pain    HPI The patient presents with concerns of erythema, edema about her left great toe and left ankle.  She has a history of multiple surgical interventions about the distal left foot, as well as a stroke has left her with impairment of the distal extremity.  She notes over the past days she has become aware of additional erythema about the first and second digits of the foot as well as edema about the distal ankle laterally. She denies any new drainage, fever, chills, nausea, vomiting. She states that she is concerned about cellulitis. The patient's primary surgeon is in [place].  She also sees Dr. Lestine Box here. Past Medical History  Diagnosis Date  . CVA (cerebral vascular accident)     Left hemiparesis, at the time of critical illness in the past  . ARDS (adult respiratory distress syndrome)   . DIC (disseminated intravascular coagulation)   . Anxiety   . Depression   . Hypothyroid   . Atrial fibrillation     Paroxysmal, at times is severe illness in the past  . S/P IVC filter     Placed 2000 with severe illness  . Mitral regurgitation     Mild  . Chest pain     Nuclear, 2003, no ischemia  . Ejection fraction     EF 60%, echo, December, 2008  . Diastolic dysfunction   . Aortic insufficiency     Mild, echo, 2008  . Foot deformity     Congenitally abnormal left foot, multiple operations over time  . 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 Appendectomy   . Tonsillectomy   . Foot surgery     left foot multiple surgeries  . Bladder tack     No family history on file.  History  Substance Use Topics  . Smoking status: Former Games developer  . Smokeless tobacco: Never Used  . Alcohol Use: Yes     occasional glass  of wine    OB History    Grav Para Term Preterm Abortions TAB SAB Ect Mult Living                  Review of Systems  All other systems reviewed and are negative.    Allergies  Review of patient's allergies indicates not on file.  Home Medications   Current Outpatient Rx  Name Route Sig Dispense Refill  . ALENDRONATE SODIUM 70 MG PO TABS Oral Take 70 mg by mouth every 7 (seven) days. Take with a full glass of water on an empty stomach.    Marland Kitchen CALCIUM-VITAMIN D 500-200 MG-UNIT PO TABS Oral Take 1 tablet by mouth daily.     Marland Kitchen CLONAZEPAM 1 MG PO TABS Oral Take 1 mg by mouth 4 (four) times daily as needed. For anxiety    . GABAPENTIN 100 MG PO CAPS Oral Take 100 mg by mouth 4 (four) times daily.     Marland Kitchen LEVOTHYROXINE SODIUM 75 MCG PO TABS Oral Take 75 mcg by mouth daily.     Marland Kitchen OMEPRAZOLE 20 MG PO CPDR Oral Take 20 mg by mouth daily.     . SENNA 8.6 MG  PO TABS Oral Take 1 tablet by mouth daily.    Marland Kitchen ZOLPIDEM TARTRATE ER 6.25 MG PO TBCR Oral Take 3.125 mg by mouth at bedtime as needed. For sleep      BP 108/56  Pulse 69  Temp 97.6 F (36.4 C) (Oral)  Resp 16  SpO2 98%  Physical Exam  Nursing note and vitals reviewed. Constitutional: She is oriented to person, place, and time. She appears well-developed and well-nourished. No distress.  HENT:  Head: Normocephalic and atraumatic.  Eyes: Conjunctivae normal and EOM are normal.  Cardiovascular: Normal rate and regular rhythm.   Pulmonary/Chest: Effort normal and breath sounds normal. No stridor. No respiratory distress.  Abdominal: She exhibits no distension.  Musculoskeletal: She exhibits no edema.       Feet:       The patient can flex and extend the foot minimally, notes that this is her baseline.  There is mild edema about the lateral ankle without any tenderness to palpation.  The patient is sensation and cap refill appropriately throughout the foot.  Neurological: She is alert and oriented to person, place, and time. No  cranial nerve deficit.  Skin: Skin is warm and dry.  Psychiatric: She has a normal mood and affect.    ED Course  Procedures (including critical care time)  Labs Reviewed  CBC - Abnormal; Notable for the following:    HCT 35.5 (*)     All other components within normal limits   Dg Toe Great Left  02/27/2012  *RADIOLOGY REPORT*  Clinical Data: Pain and swelling in the left great toe.  History of infection.  LEFT GREAT TOE  Comparison: 04/17/2011  Findings: Three views centered about left great toe.  Plate screw fixation of the first metatarsal and screw fixation of the first tarsometatarsal joint.  Mild osteopenia.  Question prior surgery of the proximal interphalangeal joint of the second digit.  No acute hardware complication.  No osseous destruction.  The attempted lateral view is suboptimal due to patient positioning. Possible soft tissue defect about the distal phalanx of the great toe.  Hammertoe deformity of the great toe.  IMPRESSION: Possible soft tissue defect about the distal aspect of the great toe.  No gross osseous destruction identified.  Mildly degraded by patient positioning.  Extensive surgical changes, without acute hardware complication.  Question proximal interphalangeal joint second digit prior surgery.   Original Report Authenticated By: Consuello Bossier, M.D.      No diagnosis found.    MDM  This generally well-appearing female presents with concerns of new erythema and edema, but no pain about the left foot where she has multiple prior surgical interventions.  I reviewed the charts that the patient brought with her given her denial of pain, fever, chills, other overt stigmata of infection, there is low suspicion initially.  The patient's lack of leukocytosis, and the lack of radiographic evidence of osteomyelitis is further reassurance.  The patient was discharged in stable condition to follow up with her orthopedist as soon as possible for additional evaluation and  management.   Gerhard Munch, MD 02/27/12 [phone]

## 2012-02-28 NOTE — ED Notes (Signed)
Report called to [facility] @ [place]; They are unable to pick up pt; Pt to go back via [facility]

## 2012-06-28 ENCOUNTER — Encounter (HOSPITAL_BASED_OUTPATIENT_CLINIC_OR_DEPARTMENT_OTHER): Payer: Self-pay | Admitting: *Deleted

## 2012-06-28 ENCOUNTER — Emergency Department (HOSPITAL_BASED_OUTPATIENT_CLINIC_OR_DEPARTMENT_OTHER)
Admission: EM | Admit: 2012-06-28 | Discharge: 2012-06-28 | Disposition: A | Discharge status: Home / Self Care | Payer: Medicare Other | Attending: Emergency Medicine | Admitting: Emergency Medicine

## 2012-06-28 DIAGNOSIS — F3289 Other specified depressive episodes: Secondary | ICD-10-CM | POA: Insufficient documentation

## 2012-06-28 DIAGNOSIS — Z8679 Personal history of other diseases of the circulatory system: Secondary | ICD-10-CM | POA: Insufficient documentation

## 2012-06-28 DIAGNOSIS — F329 Major depressive disorder, single episode, unspecified: Secondary | ICD-10-CM | POA: Insufficient documentation

## 2012-06-28 DIAGNOSIS — Z87891 Personal history of nicotine dependence: Secondary | ICD-10-CM | POA: Insufficient documentation

## 2012-06-28 DIAGNOSIS — S90812A Abrasion, left foot, initial encounter: Secondary | ICD-10-CM

## 2012-06-28 DIAGNOSIS — Y929 Unspecified place or not applicable: Secondary | ICD-10-CM | POA: Insufficient documentation

## 2012-06-28 DIAGNOSIS — Z8673 Personal history of transient ischemic attack (TIA), and cerebral infarction without residual deficits: Secondary | ICD-10-CM | POA: Insufficient documentation

## 2012-06-28 DIAGNOSIS — W19XXXA Unspecified fall, initial encounter: Secondary | ICD-10-CM | POA: Insufficient documentation

## 2012-06-28 DIAGNOSIS — Z9889 Other specified postprocedural states: Secondary | ICD-10-CM | POA: Insufficient documentation

## 2012-06-28 DIAGNOSIS — Z8639 Personal history of other endocrine, nutritional and metabolic disease: Secondary | ICD-10-CM | POA: Insufficient documentation

## 2012-06-28 DIAGNOSIS — S98139A Complete traumatic amputation of one unspecified lesser toe, initial encounter: Secondary | ICD-10-CM | POA: Insufficient documentation

## 2012-06-28 DIAGNOSIS — Z8709 Personal history of other diseases of the respiratory system: Secondary | ICD-10-CM | POA: Insufficient documentation

## 2012-06-28 DIAGNOSIS — Y939 Activity, unspecified: Secondary | ICD-10-CM | POA: Insufficient documentation

## 2012-06-28 DIAGNOSIS — IMO0002 Reserved for concepts with insufficient information to code with codable children: Secondary | ICD-10-CM | POA: Insufficient documentation

## 2012-06-28 DIAGNOSIS — F411 Generalized anxiety disorder: Secondary | ICD-10-CM | POA: Insufficient documentation

## 2012-06-28 DIAGNOSIS — Z79899 Other long term (current) drug therapy: Secondary | ICD-10-CM | POA: Insufficient documentation

## 2012-06-28 DIAGNOSIS — Z862 Personal history of diseases of the blood and blood-forming organs and certain disorders involving the immune mechanism: Secondary | ICD-10-CM | POA: Insufficient documentation

## 2012-06-28 MED ORDER — DOUBLE ANTIBIOTIC 500-10000 UNIT/GM EX OINT: 1.0000 "application " | TOPICAL_OINTMENT | Freq: Two times a day (BID) | CUTANEOUS | Status: DC

## 2012-06-28 MED ORDER — DOUBLE ANTIBIOTIC 500-10000 UNIT/GM EX OINT
TOPICAL_OINTMENT | Freq: Two times a day (BID) | CUTANEOUS | Status: DC
  Administered 2012-06-28: 15:00:00 via TOPICAL
  Filled 2012-06-28: qty 1

## 2012-06-28 NOTE — ED Provider Notes (Signed)
I have personally seen and examined the patient.  I have discussed the plan of care with the resident.  I have reviewed the documentation on PMH/FH/Soc. History.  I have reviewed the documentation of the resident and agree.   Joya Gaskins, MD 06/28/12 [phone]

## 2012-06-28 NOTE — ED Notes (Signed)
Pt states she had surgery on her left foot about 10 yrs ago "with placement of hardware". She feels it is wearing out. "Shifting".Skin is more fragile. Increased pain since last p.m.

## 2012-06-28 NOTE — ED Provider Notes (Signed)
History     CSN: [id number]  Arrival date & time 06/28/12  1356   First MD Initiated Contact with Patient 06/28/12 1405      Chief Complaint  Patient presents with  . Foot Pain    (Consider location/radiation/quality/duration/timing/severity/associated sxs/prior treatment) HPI Comments: 72 y.o PMH osteoporosis, foot deformity (pes planus) and left hemiplegia status post stroke (age 58 y.o) (status post foot surgery in [place] in previous years with hardware), Atrial fibrillation, s/p IVC, mild aortic regurgitation, mild diastolic dysfunction and other significant PMH  She presents to get her left great toe examined with history of cellulitis left foot 05/2010.  She reports fragile skin in between left great toe and 2nd toe, denies pain.  She constantly checks her foot after having cellulitis in that area and gets it checked out for reassurance.  She thinks the way her bones are and the pressure from her shoe causes the skin to be fragile.  She previously saw her orthopedic surgeon in [place] in 03/2012 after in the Fall 2013 she felt like the hardware in her left foot was shifting.  The doctor in [place] referred her to Dr. Tommie Raymond who recommends conservative management and orthotic shoes.  She was also followed at [facility] orthopedics (Dr. Ander Slade) who has relocated to [place].  PSuH: appendectomy PCP: Dr. Marlou Starks    The history is provided by the patient. No language interpreter was used.    Past Medical History  Diagnosis Date  . CVA (cerebral vascular accident)     Left hemiparesis, at the time of critical illness in the past  . ARDS (adult respiratory distress syndrome)   . DIC (disseminated intravascular coagulation)   . Anxiety   . Depression   . Hypothyroid   . Atrial fibrillation     Paroxysmal, at times is severe illness in the past  . S/P IVC filter     Placed 2000 with severe illness  . Mitral regurgitation     Mild  . Chest pain     Nuclear,  2003, no ischemia  . Ejection fraction     EF 60%, echo, December, 2008  . Diastolic dysfunction   . Aortic insufficiency     Mild, echo, 2008  . Foot deformity     Congenitally abnormal left foot, multiple operations over time  . 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 Appendectomy    . Tonsillectomy    . Foot surgery      left foot multiple surgeries  . Bladder tack      History reviewed. No pertinent family history.  History  Substance Use Topics  . Smoking status: Former Games developer  . Smokeless tobacco: Never Used  . Alcohol Use: Yes     Comment: occasional glass of wine    OB History   Grav Para Term Preterm Abortions TAB SAB Ect Mult Living                  Review of Systems  Musculoskeletal: Negative for joint swelling and arthralgias.    Allergies  Review of patient's allergies indicates no known allergies.  Home Medications   Current Outpatient Rx  Name  Route  Sig  Dispense  Refill  . alendronate (FOSAMAX) 70 MG tablet   Oral   Take 70 mg by mouth every 7 (seven) days. Take with  a full glass of water on an empty stomach.         . Calcium Carbonate-Vitamin D (CALCIUM-VITAMIN D) 500-200 MG-UNIT per tablet   Oral   Take 1 tablet by mouth daily.          . clonazePAM (KLONOPIN) 1 MG tablet   Oral   Take 1 mg by mouth 4 (four) times daily as needed. For anxiety         . gabapentin (NEURONTIN) 100 MG capsule   Oral   Take 100 mg by mouth 4 (four) times daily.          Marland Kitchen levothyroxine (SYNTHROID, LEVOTHROID) 75 MCG tablet   Oral   Take 75 mcg by mouth daily.          Marland Kitchen omeprazole (PRILOSEC) 20 MG capsule   Oral   Take 20 mg by mouth daily.          . polymixin-bacitracin (POLYSPORIN) 500-10000 UNIT/GM OINT ointment   Topical   Apply 1 application topically 2 (two) times daily.   1 Tube   1   . senna (SENOKOT) 8.6 MG TABS   Oral   Take  1 tablet by mouth daily.         Marland Kitchen zolpidem (AMBIEN CR) 6.25 MG CR tablet   Oral   Take 3.125 mg by mouth at bedtime as needed. For sleep           BP 111/68  Pulse 98  Temp(Src) 97.9 F (36.6 C) (Oral)  Resp 20  Ht 5' 6.5" (1.689 m)  Wt 110 lb (49.896 kg)  BMI 17.49 kg/m2  SpO2 99%  Physical Exam  Nursing note and vitals reviewed. Constitutional: She is oriented to person, place, and time. She appears well-developed and well-nourished. No distress.  HENT:  Head: Normocephalic and atraumatic.  Mouth/Throat: No oropharyngeal exudate.  Eyes: Conjunctivae are normal. Right eye exhibits no discharge. Left eye exhibits no discharge. No scleral icterus.  Neurological: She is alert and oriented to person, place, and time.  Skin: Skin is warm, dry and intact. No rash noted. She is not diaphoretic.     Psychiatric: She has a normal mood and affect. Her speech is normal and behavior is normal. Judgment and thought content normal. Cognition and memory are normal.    ED Course  Procedures (including critical care time)  Labs Reviewed - No data to display No results found.   1. Abrasion of left foot       MDM  Left great toe abrasion Bacitracin topically Open foot to air when no need for shoes  Rec. Orthotic shoes  F/u with PCP and orthopedic prn    Shirlee Latch MD [phone]     Annett Gula, MD 06/28/12 [phone]

## 2013-02-04 ENCOUNTER — Encounter (HOSPITAL_BASED_OUTPATIENT_CLINIC_OR_DEPARTMENT_OTHER): Payer: Self-pay

## 2013-02-04 ENCOUNTER — Emergency Department (HOSPITAL_BASED_OUTPATIENT_CLINIC_OR_DEPARTMENT_OTHER)
Admission: EM | Admit: 2013-02-04 | Discharge: 2013-02-04 | Disposition: A | Discharge status: Home / Self Care | Payer: Medicare Other | Attending: Emergency Medicine | Admitting: Emergency Medicine

## 2013-02-04 ENCOUNTER — Emergency Department (HOSPITAL_BASED_OUTPATIENT_CLINIC_OR_DEPARTMENT_OTHER): Payer: Medicare Other

## 2013-02-04 DIAGNOSIS — E039 Hypothyroidism, unspecified: Secondary | ICD-10-CM | POA: Insufficient documentation

## 2013-02-04 DIAGNOSIS — Z79899 Other long term (current) drug therapy: Secondary | ICD-10-CM | POA: Insufficient documentation

## 2013-02-04 DIAGNOSIS — F3289 Other specified depressive episodes: Secondary | ICD-10-CM | POA: Insufficient documentation

## 2013-02-04 DIAGNOSIS — Z87891 Personal history of nicotine dependence: Secondary | ICD-10-CM | POA: Insufficient documentation

## 2013-02-04 DIAGNOSIS — Z8673 Personal history of transient ischemic attack (TIA), and cerebral infarction without residual deficits: Secondary | ICD-10-CM | POA: Insufficient documentation

## 2013-02-04 DIAGNOSIS — D65 Disseminated intravascular coagulation [defibrination syndrome]: Secondary | ICD-10-CM | POA: Insufficient documentation

## 2013-02-04 DIAGNOSIS — F411 Generalized anxiety disorder: Secondary | ICD-10-CM | POA: Insufficient documentation

## 2013-02-04 DIAGNOSIS — Z8709 Personal history of other diseases of the respiratory system: Secondary | ICD-10-CM | POA: Insufficient documentation

## 2013-02-04 DIAGNOSIS — F329 Major depressive disorder, single episode, unspecified: Secondary | ICD-10-CM | POA: Insufficient documentation

## 2013-02-04 DIAGNOSIS — L089 Local infection of the skin and subcutaneous tissue, unspecified: Secondary | ICD-10-CM | POA: Insufficient documentation

## 2013-02-04 DIAGNOSIS — Z8679 Personal history of other diseases of the circulatory system: Secondary | ICD-10-CM | POA: Insufficient documentation

## 2013-02-04 LAB — BASIC METABOLIC PANEL
BUN: 11 mg/dL (ref 6–23)
Calcium: 9.4 mg/dL (ref 8.4–10.5)
Creatinine, Ser: 0.7 mg/dL (ref 0.50–1.10)
GFR calc non Af Amer: 86 mL/min — ABNORMAL LOW (ref >90)
Glucose, Bld: 156 mg/dL — ABNORMAL HIGH (ref 70–99)

## 2013-02-04 LAB — CBC
HCT: 38 % (ref 36.0–46.0)
Hemoglobin: 12.5 g/dL (ref 12.0–15.0)
MCH: 30 pg (ref 26.0–34.0)
MCHC: 32.9 g/dL (ref 30.0–36.0)
RDW: 12.8 % (ref 11.5–15.5)

## 2013-02-04 MED ORDER — CEPHALEXIN 500 MG PO CAPS: 500.0000 mg | ORAL_CAPSULE | Freq: Four times a day (QID) | ORAL | Status: DC

## 2013-02-04 NOTE — ED Notes (Signed)
MD at bedside. 

## 2013-02-04 NOTE — ED Notes (Signed)
Return from xray

## 2013-02-04 NOTE — ED Provider Notes (Signed)
CSN: [id number]     Arrival date & time 02/04/13  1151 History   First MD Initiated Contact with Patient 02/04/13 1205     Chief Complaint  Patient presents with  . Foot Pain   (Consider location/radiation/quality/duration/timing/severity/associated sxs/prior Treatment) Patient is a 72 y.o. female presenting with lower extremity pain.  Foot Pain This is a new problem. The current episode started 12 to 24 hours ago. The problem occurs constantly. The problem has not changed since onset.Pertinent negatives include no chest pain, no abdominal pain and no shortness of breath. Nothing aggravates the symptoms. Nothing relieves the symptoms.    Past Medical History  Diagnosis Date  . CVA (cerebral vascular accident)     Left hemiparesis, at the time of critical illness in the past  . ARDS (adult respiratory distress syndrome)   . DIC (disseminated intravascular coagulation)   . Anxiety   . Depression   . Hypothyroid   . Atrial fibrillation     Paroxysmal, at times is severe illness in the past  . S/P IVC filter     Placed 2000 with severe illness  . Mitral regurgitation     Mild  . Chest pain     Nuclear, 2003, no ischemia  . Ejection fraction     EF 60%, echo, December, 2008  . Diastolic dysfunction   . Aortic insufficiency     Mild, echo, 2008  . Foot deformity     Congenitally abnormal left foot, multiple operations over time  . 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 Appendectomy    . Tonsillectomy    . Foot surgery      left foot multiple surgeries  . Bladder tack     No family history on file. History  Substance Use Topics  . Smoking status: Former Games developer  . Smokeless tobacco: Never Used  . Alcohol Use: Yes     Comment: occasional glass of wine   OB History   Grav Para Term Preterm Abortions TAB SAB Ect Mult Living                 Review of Systems  Constitutional:  Negative for fever.  Respiratory: Negative for cough and shortness of breath.   Cardiovascular: Negative for chest pain.  Gastrointestinal: Negative for nausea, vomiting and abdominal pain.  All other systems reviewed and are negative.    Allergies  Review of patient's allergies indicates no known allergies.  Home Medications   Current Outpatient Rx  Name  Route  Sig  Dispense  Refill  . UNABLE TO FIND      rx ointment to foot-unsure of name         . alendronate (FOSAMAX) 70 MG tablet   Oral   Take 70 mg by mouth every 7 (seven) days. Take with a full glass of water on an empty stomach.         . Calcium Carbonate-Vitamin D (CALCIUM-VITAMIN D) 500-200 MG-UNIT per tablet   Oral   Take 1 tablet by mouth daily.          . clonazePAM (KLONOPIN) 1 MG tablet   Oral   Take 1 mg by mouth 4 (four) times daily as needed. For anxiety         . gabapentin (NEURONTIN) 100 MG capsule   Oral   Take 100 mg by mouth 4 (four)  times daily.          Marland Kitchen levothyroxine (SYNTHROID, LEVOTHROID) 75 MCG tablet   Oral   Take 75 mcg by mouth daily.          Marland Kitchen omeprazole (PRILOSEC) 20 MG capsule   Oral   Take 20 mg by mouth daily.          . polymixin-bacitracin (POLYSPORIN) 500-10000 UNIT/GM OINT ointment   Topical   Apply 1 application topically 2 (two) times daily.   1 Tube   1   . senna (SENOKOT) 8.6 MG TABS   Oral   Take 1 tablet by mouth daily.         Marland Kitchen zolpidem (AMBIEN CR) 6.25 MG CR tablet   Oral   Take 3.125 mg by mouth at bedtime as needed. For sleep          BP 106/50  Pulse 72  Temp(Src) 97.5 F (36.4 C) (Oral)  Resp 14  Wt 108 lb (48.988 kg)  BMI 17.17 kg/m2  SpO2 100% Physical Exam  Nursing note and vitals reviewed. Constitutional: She is oriented to person, place, and time. She appears well-developed and well-nourished. No distress.  HENT:  Head: Normocephalic and atraumatic.  Eyes: EOM are normal. Pupils are equal, round, and reactive to  light.  Neck: Normal range of motion. Neck supple.  Cardiovascular: Normal rate and regular rhythm.  Exam reveals no friction rub.   No murmur heard. Pulmonary/Chest: Effort normal and breath sounds normal. No respiratory distress. She has no wheezes. She has no rales.  Abdominal: Soft. She exhibits no distension. There is no tenderness. There is no rebound.  Musculoskeletal: Normal range of motion. She exhibits no edema.       Feet:  Neurological: She is alert and oriented to person, place, and time.  Skin: She is not diaphoretic.    ED Course  Procedures (including critical care time) Labs Review Labs Reviewed - No data to display Imaging Review Dg Foot Complete Left  02/04/2013   CLINICAL DATA:  Swollen left foot  EXAM: LEFT FOOT - COMPLETE 3+ VIEW  COMPARISON:  Left toe films of 02/27/2012  FINDINGS: The bones are diffusely osteopenic. A fixation plate crosses the left 1st MTP joint and a screw traverses the junction between the base of the 1st metatarsal and the 1st cuneiform. No acute fracture is seen. Slight subluxation at the left 2nd PIP joint appears stable. No evidence of osteomyelitis is seen. U shaped pins are noted for fusion of the mid and hindfoot. There is pes planus present.  IMPRESSION: Hardware as noted previously. No evidence of osteomyelitis. Diffuse osteopenia.   Electronically Signed   By: Dwyane Dee M.D.   On: 02/04/2013 13:27    MDM   1. Foot infection    60F presents with left foot pain. History of multiple surgeries with remaining hardware in her left foot. She states pain at the MTP joint of her left great toe. She had some pus come out of her bandage recently. On exam, her left great toe is warm with some mild redness. She has an open sore with purulent drainage. Drainage is scant. She has range of motion of the toe, however is tender to touch. I will check an x-ray and basic labs. I am concerned about a skin vs hardware infection. No signs of osteo on xray.  Normal white count. I spoke with Dr. Yisroel Ramming who agreed with Keflex and outpatient follow-up. Stable for discharge.  Dagmar Hait, MD 02/04/13 [phone]

## 2013-02-04 NOTE — ED Notes (Signed)
Pt reports multiple issues with left foot for years-most recent she had nail clipped by podiatrist and has been dressing changing-this am she noticed "pus" drainage on dsg and foot felt warm to touch

## 2013-02-23 ENCOUNTER — Emergency Department (HOSPITAL_BASED_OUTPATIENT_CLINIC_OR_DEPARTMENT_OTHER): Payer: Medicare Other

## 2013-02-23 ENCOUNTER — Encounter (HOSPITAL_BASED_OUTPATIENT_CLINIC_OR_DEPARTMENT_OTHER): Payer: Self-pay | Admitting: Emergency Medicine

## 2013-02-23 ENCOUNTER — Emergency Department (HOSPITAL_BASED_OUTPATIENT_CLINIC_OR_DEPARTMENT_OTHER)
Admission: EM | Admit: 2013-02-23 | Discharge: 2013-02-23 | Disposition: A | Discharge status: Home / Self Care | Payer: Medicare Other | Attending: Emergency Medicine | Admitting: Emergency Medicine

## 2013-02-23 DIAGNOSIS — E039 Hypothyroidism, unspecified: Secondary | ICD-10-CM | POA: Insufficient documentation

## 2013-02-23 DIAGNOSIS — F3289 Other specified depressive episodes: Secondary | ICD-10-CM | POA: Insufficient documentation

## 2013-02-23 DIAGNOSIS — Z9889 Other specified postprocedural states: Secondary | ICD-10-CM | POA: Insufficient documentation

## 2013-02-23 DIAGNOSIS — L03032 Cellulitis of left toe: Secondary | ICD-10-CM

## 2013-02-23 DIAGNOSIS — F411 Generalized anxiety disorder: Secondary | ICD-10-CM | POA: Insufficient documentation

## 2013-02-23 DIAGNOSIS — Z8673 Personal history of transient ischemic attack (TIA), and cerebral infarction without residual deficits: Secondary | ICD-10-CM | POA: Insufficient documentation

## 2013-02-23 DIAGNOSIS — F329 Major depressive disorder, single episode, unspecified: Secondary | ICD-10-CM | POA: Insufficient documentation

## 2013-02-23 DIAGNOSIS — K5900 Constipation, unspecified: Secondary | ICD-10-CM | POA: Insufficient documentation

## 2013-02-23 DIAGNOSIS — Z79899 Other long term (current) drug therapy: Secondary | ICD-10-CM | POA: Insufficient documentation

## 2013-02-23 DIAGNOSIS — S98139A Complete traumatic amputation of one unspecified lesser toe, initial encounter: Secondary | ICD-10-CM | POA: Insufficient documentation

## 2013-02-23 DIAGNOSIS — Z87891 Personal history of nicotine dependence: Secondary | ICD-10-CM | POA: Insufficient documentation

## 2013-02-23 DIAGNOSIS — Z8679 Personal history of other diseases of the circulatory system: Secondary | ICD-10-CM | POA: Insufficient documentation

## 2013-02-23 DIAGNOSIS — Z862 Personal history of diseases of the blood and blood-forming organs and certain disorders involving the immune mechanism: Secondary | ICD-10-CM | POA: Insufficient documentation

## 2013-02-23 DIAGNOSIS — L02619 Cutaneous abscess of unspecified foot: Secondary | ICD-10-CM | POA: Insufficient documentation

## 2013-02-23 DIAGNOSIS — L03039 Cellulitis of unspecified toe: Secondary | ICD-10-CM | POA: Insufficient documentation

## 2013-02-23 DIAGNOSIS — R197 Diarrhea, unspecified: Secondary | ICD-10-CM | POA: Insufficient documentation

## 2013-02-23 DIAGNOSIS — K589 Irritable bowel syndrome without diarrhea: Secondary | ICD-10-CM | POA: Insufficient documentation

## 2013-02-23 DIAGNOSIS — Z8739 Personal history of other diseases of the musculoskeletal system and connective tissue: Secondary | ICD-10-CM | POA: Insufficient documentation

## 2013-02-23 DIAGNOSIS — Z8709 Personal history of other diseases of the respiratory system: Secondary | ICD-10-CM | POA: Insufficient documentation

## 2013-02-23 LAB — COMPREHENSIVE METABOLIC PANEL
Albumin: 4 g/dL (ref 3.5–5.2)
Alkaline Phosphatase: 46 U/L (ref 39–117)
BUN: 12 mg/dL (ref 6–23)
Potassium: 3.6 mEq/L (ref 3.5–5.1)
Total Protein: 6.5 g/dL (ref 6.0–8.3)

## 2013-02-23 LAB — CBC WITH DIFFERENTIAL/PLATELET
Basophils Absolute: 0 10*3/uL (ref 0.0–0.1)
Basophils Relative: 1 % (ref 0–1)
Eosinophils Absolute: 0.1 10*3/uL (ref 0.0–0.7)
Hemoglobin: 13.1 g/dL (ref 12.0–15.0)
MCH: 30.3 pg (ref 26.0–34.0)
MCHC: 33.7 g/dL (ref 30.0–36.0)
Monocytes Relative: 8 % (ref 3–12)
Neutrophils Relative %: 50 % (ref 43–77)
Platelets: 268 10*3/uL (ref 150–400)
RDW: 12.5 % (ref 11.5–15.5)

## 2013-02-23 MED ORDER — CEPHALEXIN 500 MG PO CAPS: 500.0000 mg | ORAL_CAPSULE | Freq: Four times a day (QID) | ORAL | Status: DC

## 2013-02-23 MED ORDER — DICYCLOMINE HCL 20 MG PO TABS: 20.0000 mg | ORAL_TABLET | Freq: Two times a day (BID) | ORAL | Status: DC

## 2013-02-23 NOTE — ED Provider Notes (Signed)
CSN: [id number]     Arrival date & time 02/23/13  1921 History  This chart was scribed for Shanna Cisco, MD by Blanchard Kelch, ED Scribe. The patient was seen in room MH03/MH03. Patient's care was started at 7:36 PM.    Chief Complaint  Patient presents with  . Abdominal Pain    Patient is a 72 y.o. female presenting with abdominal pain. The history is provided by the patient. No language interpreter was used.  Abdominal Pain Pain location:  Generalized Pain quality: burning   Pain radiates to:  Does not radiate Duration:  2 days Timing:  Intermittent Progression:  Resolved Chronicity:  Chronic Context comment:  History of IBS Relieved by:  Nothing Associated symptoms: constipation and diarrhea   Associated symptoms: no chest pain, no chills, no cough, no dysuria, no fatigue, no fever, no nausea, no shortness of breath, no sore throat and no vomiting   Diarrhea:    Quality:  Watery   Severity:  Moderate   Duration:  1 day Risk factors: being elderly     HPI Comments: [name] is a 72 y.o. female with a history of IBS who presents to the Emergency Department complaining of intermittent, generalized abdominal pain that began yesterday. The pain is non-radiating and described as burning. Both the episode yesterday and the episode today lasted two hours. She reports using Milk of Magnesia last night for the pain. She reports associated watery diarrhea with the pain. She states that she still feels full after these episodes of diarrhea. She states that these symptoms are typical of her IBS episodes.  She does not remember her last normal bowel movement. She states her bowel movements are usually not watery and she gets both constipation and diarrhea. She will occasionally get tarry, thick stools with episodes. She denies nausea, vomiting, fever, chills, dysuria, frequency or difficulty urinating. Her last colonoscopy was a year and a half ago and was normal. She denies a history of  hemorrhoids.    She is also complaining of left great toe warmth. She has a past surgical history of foot surgery due to a stroke. She states she felt warmth on the big toe today. She has had past infections on the toe. She denies drainage, pain or increased redness of the toe, but states it is usually a little red.  Past Medical History  Diagnosis Date  . CVA (cerebral vascular accident)     Left hemiparesis, at the time of critical illness in the past  . ARDS (adult respiratory distress syndrome)   . DIC (disseminated intravascular coagulation)   . Anxiety   . Depression   . Hypothyroid   . Atrial fibrillation     Paroxysmal, at times is severe illness in the past  . S/P IVC filter     Placed 2000 with severe illness  . Mitral regurgitation     Mild  . Chest pain     Nuclear, 2003, no ischemia  . Ejection fraction     EF 60%, echo, December, 2008  . Diastolic dysfunction   . Aortic insufficiency     Mild, echo, 2008  . Foot deformity     Congenitally abnormal left foot, multiple operations over time  . 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 Appendectomy    . Tonsillectomy    . Foot surgery  left foot multiple surgeries  . Bladder tack     No family history on file. History  Substance Use Topics  . Smoking status: Former Games developer  . Smokeless tobacco: Never Used  . Alcohol Use: Yes     Comment: occasional glass of wine   OB History   Grav Para Term Preterm Abortions TAB SAB Ect Mult Living                 Review of Systems  Constitutional: Negative for fever, chills, diaphoresis, activity change, appetite change and fatigue.  HENT: Negative for congestion, facial swelling, rhinorrhea and sore throat.   Eyes: Negative for photophobia and discharge.  Respiratory: Negative for cough, chest tightness and shortness of breath.   Cardiovascular: Negative for chest pain,  palpitations and leg swelling.  Gastrointestinal: Positive for abdominal pain, diarrhea and constipation. Negative for nausea and vomiting.  Endocrine: Negative for polydipsia and polyuria.  Genitourinary: Negative for dysuria, frequency, difficulty urinating and pelvic pain.  Musculoskeletal: Negative for arthralgias, back pain, neck pain and neck stiffness.  Skin: Negative for color change and wound.  Allergic/Immunologic: Negative for immunocompromised state.  Neurological: Negative for facial asymmetry, weakness, numbness and headaches.  Hematological: Does not bruise/bleed easily.  Psychiatric/Behavioral: Negative for confusion and agitation.    Allergies  Review of patient's allergies indicates no known allergies.  Home Medications   Current Outpatient Rx  Name  Route  Sig  Dispense  Refill  . alendronate (FOSAMAX) 70 MG tablet   Oral   Take 70 mg by mouth every 7 (seven) days. Take with a full glass of water on an empty stomach.         . Calcium Carbonate-Vitamin D (CALCIUM-VITAMIN D) 500-200 MG-UNIT per tablet   Oral   Take 1 tablet by mouth daily.          . cephALEXin (KEFLEX) 500 MG capsule   Oral   Take 1 capsule (500 mg total) by mouth 4 (four) times daily.   28 capsule   0   . cephALEXin (KEFLEX) 500 MG capsule   Oral   Take 1 capsule (500 mg total) by mouth 4 (four) times daily.   28 capsule   0   . clonazePAM (KLONOPIN) 1 MG tablet   Oral   Take 1 mg by mouth 4 (four) times daily as needed. For anxiety         . dicyclomine (BENTYL) 20 MG tablet   Oral   Take 1 tablet (20 mg total) by mouth 2 (two) times daily.   20 tablet   0   . gabapentin (NEURONTIN) 100 MG capsule   Oral   Take 100 mg by mouth 4 (four) times daily.          Marland Kitchen levothyroxine (SYNTHROID, LEVOTHROID) 75 MCG tablet   Oral   Take 75 mcg by mouth daily.          Marland Kitchen omeprazole (PRILOSEC) 20 MG capsule   Oral   Take 20 mg by mouth daily.          .  polymixin-bacitracin (POLYSPORIN) 500-10000 UNIT/GM OINT ointment   Topical   Apply 1 application topically 2 (two) times daily.   1 Tube   1   . senna (SENOKOT) 8.6 MG TABS   Oral   Take 1 tablet by mouth daily.         Marland Kitchen UNABLE TO FIND      rx ointment to foot-unsure of name         .  zolpidem (AMBIEN CR) 6.25 MG CR tablet   Oral   Take 3.125 mg by mouth at bedtime as needed. For sleep          Triage Vitals: BP 104/73  Pulse 94  Temp(Src) 98.4 F (36.9 C) (Oral)  Resp 20  Wt 108 lb (48.988 kg)  BMI 17.17 kg/m2  SpO2 95%  Physical Exam  Nursing note and vitals reviewed. Constitutional: She is oriented to person, place, and time. She appears well-developed and well-nourished. No distress.  HENT:  Head: Normocephalic.  Mouth/Throat: Oropharynx is clear and moist.  Eyes: Pupils are equal, round, and reactive to light.  Neck: Neck supple.  Cardiovascular: Normal rate, regular rhythm and normal heart sounds.   Pulmonary/Chest: Effort normal and breath sounds normal. No respiratory distress. She has no wheezes.  Abdominal: Soft. She exhibits no distension. There is no tenderness. There is no rebound and no guarding.  Genitourinary:  Chaperone present for rectal exam, notable for vault empty.   Musculoskeletal: She exhibits no edema and no tenderness.       Feet:  Neurological: She is alert and oriented to person, place, and time.  Skin: Skin is warm and dry.  Psychiatric: She has a normal mood and affect.    ED Course  Procedures (including critical care time)  DIAGNOSTIC STUDIES: Oxygen Saturation is 95% on room air, normal by my interpretation.    COORDINATION OF CARE: 8:01 PM -Will order abdominal x-ray. Patient verbalizes understanding and agrees with treatment plan.    Labs Review Labs Reviewed  COMPREHENSIVE METABOLIC PANEL - Abnormal; Notable for the following:    GFR calc non Af Amer 73 (*)    GFR calc Af Amer 85 (*)    All other components  within normal limits  CBC WITH DIFFERENTIAL   Imaging Review Dg Abd 1 View  02/23/2013   CLINICAL DATA:  Diarrhea following constipation.  EXAM: ABDOMEN - 1 VIEW  COMPARISON:  06/25/2010.  FINDINGS: Surgical clips right lower quadrant in the region of the cecum are stable. There is no visible bowel obstruction or free intraperitoneal air. Stool burden is minimal. Unchanged IVC filter. Chronic degenerative disc disease L5-S1. Improved appearance from priors.  IMPRESSION: Nonobstructive gas pattern.   Electronically Signed   By: Davonna Belling M.D.   On: 02/23/2013 20:33   Dg Foot Complete Left  02/23/2013   CLINICAL DATA:  Left great toe redness.  Previous surgery.  EXAM: LEFT FOOT - COMPLETE 3+ VIEW  COMPARISON:  02/04/2013.  FINDINGS: The bones are diffusely osteopenic. Extensive hardware throughout the foot is unchanged, with significant midfoot and hindfoot arthrodesis. Slight subluxation left 2nd toe PIP joint. No evidence for great toe osteomyelitis. No change from priors. Pes planus.  IMPRESSION: No evidence for osteomyelitis.  Stable arthrodesis.   Electronically Signed   By: Davonna Belling M.D.   On: 02/23/2013 20:31    EKG Interpretation   None       MDM   1. IBS (irritable bowel syndrome)   2. Cellulitis of great toe of left foot    Pt is a 71 y.o. female with Pmhx as above who presents with several days of worsening of her typical IBS symptoms with more frequent d/a, sensation of abdominal fullness, and two epsiodes of 1-2 hrs of abdominal "burning" last night and earlier today.  No n/v, fever, no ab pain currently.  Rectal vault empty.  Abdominal exam benign. Denies urinary symptoms. AAS unremarkable. CBC, CMP also unremarkable. I  doubt SBO, for acute surgical cause of symptoms. Will given rx for trial of bentyl and will ask her to f/u with PCP for symptoms and discuss GI consult as outpt.  Additionally, she states her L great toe is warmer that usual and she has hardware in the toe.   She has unchanged scab over toe which has not been drainage. She has not had increased pain or redness of the toe. XR unchanged from prior w/ no change of hardware.  Given redness, warmth, will start on PO keflex for mild cellulitis and have her follow closely w/ PCP for recheck.  Return precautions given for new or worsening symptoms including worsneing pain, inability to tolerate PO, fever, worsening redness of toe.      I personally performed the services described in this documentation, which was scribed in my presence. The recorded information has been reviewed and is accurate.   Shanna Cisco, MD 02/24/13 [phone]

## 2013-02-23 NOTE — ED Notes (Signed)
Abdominal pain x 2 days. Diarrhea. Hx IBS.

## 2013-03-17 ENCOUNTER — Encounter: Payer: Self-pay | Admitting: Cardiology

## 2013-03-17 ENCOUNTER — Ambulatory Visit (INDEPENDENT_AMBULATORY_CARE_PROVIDER_SITE_OTHER): Payer: Medicare Other | Admitting: Cardiology

## 2013-03-17 ENCOUNTER — Emergency Department (HOSPITAL_BASED_OUTPATIENT_CLINIC_OR_DEPARTMENT_OTHER)
Admission: EM | Admit: 2013-03-17 | Discharge: 2013-03-17 | Disposition: A | Discharge status: Home / Self Care | Payer: Medicare Other | Attending: Emergency Medicine | Admitting: Emergency Medicine

## 2013-03-17 ENCOUNTER — Encounter (HOSPITAL_BASED_OUTPATIENT_CLINIC_OR_DEPARTMENT_OTHER): Payer: Self-pay | Admitting: Emergency Medicine

## 2013-03-17 VITALS — BP 124/64 | HR 82 | Ht 66.5 in | Wt 109.0 lb

## 2013-03-17 DIAGNOSIS — Z87891 Personal history of nicotine dependence: Secondary | ICD-10-CM | POA: Insufficient documentation

## 2013-03-17 DIAGNOSIS — Z8719 Personal history of other diseases of the digestive system: Secondary | ICD-10-CM | POA: Insufficient documentation

## 2013-03-17 DIAGNOSIS — F329 Major depressive disorder, single episode, unspecified: Secondary | ICD-10-CM | POA: Insufficient documentation

## 2013-03-17 DIAGNOSIS — Z862 Personal history of diseases of the blood and blood-forming organs and certain disorders involving the immune mechanism: Secondary | ICD-10-CM | POA: Insufficient documentation

## 2013-03-17 DIAGNOSIS — Z9889 Other specified postprocedural states: Secondary | ICD-10-CM | POA: Insufficient documentation

## 2013-03-17 DIAGNOSIS — E039 Hypothyroidism, unspecified: Secondary | ICD-10-CM | POA: Insufficient documentation

## 2013-03-17 DIAGNOSIS — Z8673 Personal history of transient ischemic attack (TIA), and cerebral infarction without residual deficits: Secondary | ICD-10-CM | POA: Insufficient documentation

## 2013-03-17 DIAGNOSIS — I4891 Unspecified atrial fibrillation: Secondary | ICD-10-CM

## 2013-03-17 DIAGNOSIS — K589 Irritable bowel syndrome without diarrhea: Secondary | ICD-10-CM

## 2013-03-17 DIAGNOSIS — S98139A Complete traumatic amputation of one unspecified lesser toe, initial encounter: Secondary | ICD-10-CM | POA: Insufficient documentation

## 2013-03-17 DIAGNOSIS — L02619 Cutaneous abscess of unspecified foot: Secondary | ICD-10-CM | POA: Insufficient documentation

## 2013-03-17 DIAGNOSIS — F3289 Other specified depressive episodes: Secondary | ICD-10-CM | POA: Insufficient documentation

## 2013-03-17 DIAGNOSIS — L03031 Cellulitis of right toe: Secondary | ICD-10-CM

## 2013-03-17 DIAGNOSIS — L03039 Cellulitis of unspecified toe: Secondary | ICD-10-CM | POA: Insufficient documentation

## 2013-03-17 DIAGNOSIS — Z8709 Personal history of other diseases of the respiratory system: Secondary | ICD-10-CM | POA: Insufficient documentation

## 2013-03-17 DIAGNOSIS — I351 Nonrheumatic aortic (valve) insufficiency: Secondary | ICD-10-CM

## 2013-03-17 DIAGNOSIS — Z79899 Other long term (current) drug therapy: Secondary | ICD-10-CM | POA: Insufficient documentation

## 2013-03-17 DIAGNOSIS — Z792 Long term (current) use of antibiotics: Secondary | ICD-10-CM | POA: Insufficient documentation

## 2013-03-17 DIAGNOSIS — F411 Generalized anxiety disorder: Secondary | ICD-10-CM | POA: Insufficient documentation

## 2013-03-17 DIAGNOSIS — I359 Nonrheumatic aortic valve disorder, unspecified: Secondary | ICD-10-CM

## 2013-03-17 MED ORDER — CEPHALEXIN 500 MG PO CAPS: 500.0000 mg | ORAL_CAPSULE | Freq: Three times a day (TID) | ORAL | Status: DC

## 2013-03-17 MED ORDER — CEPHALEXIN 250 MG PO CAPS
ORAL_CAPSULE | ORAL | Status: AC
  Filled 2013-03-17: qty 2

## 2013-03-17 MED ORDER — CEPHALEXIN 250 MG PO CAPS
500.0000 mg | ORAL_CAPSULE | Freq: Once | ORAL | Status: AC
  Administered 2013-03-17: 500 mg via ORAL

## 2013-03-17 NOTE — ED Notes (Signed)
MD at bedside. 

## 2013-03-17 NOTE — Assessment & Plan Note (Signed)
She has not had any recurrent atrial fibrillation. No further workup.

## 2013-03-17 NOTE — Progress Notes (Signed)
HPI  Patient is seen today to followup a remote history of atrial fibrillation. She had a very severe illness in 2000. She required she required an IVC filter at that time. She did recover. She has very slight valvular disease. She's had some atrial fib associated with severe illness in the past. She has not had any other time. Anticoagulation has not been needed.  She says today that she is being bothered by her ear or bowel symptoms. She also tells me about medication she is receiving from her psychiatrist she feels is not helping.  No Known Allergies  Current Outpatient Prescriptions  Medication Sig Dispense Refill  . alendronate (FOSAMAX) 70 MG tablet Take 70 mg by mouth every 7 (seven) days. Take with a full glass of water on an empty stomach.      . Calcium Carbonate-Vitamin D (CALCIUM-VITAMIN D) 500-200 MG-UNIT per tablet Take 1 tablet by mouth daily.       . cephALEXin (KEFLEX) 500 MG capsule Take 1 capsule (500 mg total) by mouth 4 (four) times daily.  28 capsule  0  . clonazePAM (KLONOPIN) 1 MG tablet Take 1 mg by mouth 4 (four) times daily as needed. For anxiety      . dicyclomine (BENTYL) 20 MG tablet Take 1 tablet (20 mg total) by mouth 2 (two) times daily.  20 tablet  0  . gabapentin (NEURONTIN) 100 MG capsule Take 100 mg by mouth 4 (four) times daily.       Marland Kitchen levothyroxine (SYNTHROID, LEVOTHROID) 75 MCG tablet Take 75 mcg by mouth daily.       Marland Kitchen omeprazole (PRILOSEC) 20 MG capsule Take 20 mg by mouth daily.       . polymixin-bacitracin (POLYSPORIN) 500-10000 UNIT/GM OINT ointment Apply 1 application topically 2 (two) times daily.  1 Tube  1  . UNABLE TO FIND rx ointment to foot-unsure of name      . zolpidem (AMBIEN CR) 6.25 MG CR tablet Take 3.125 mg by mouth at bedtime as needed. For sleep      . venlafaxine XR (EFFEXOR-XR) 75 MG 24 hr capsule        No current facility-administered medications for this visit.    History   Social History  . Marital Status: Divorced      Spouse Name: N/A    Number of Children: N/A  . Years of Education: N/A   Occupational History  . Not on file.   Social History Main Topics  . Smoking status: Former Games developer  . Smokeless tobacco: Never Used  . Alcohol Use: Yes     Comment: occasional glass of wine  . Drug Use: No  . Sexual Activity:    Other Topics Concern  . Not on file   Social History Narrative  . No narrative on file    No family history on file.  Past Medical History  Diagnosis Date  . CVA (cerebral vascular accident)     Left hemiparesis, at the time of critical illness in the past  . ARDS (adult respiratory distress syndrome)   . DIC (disseminated intravascular coagulation)   . Anxiety   . Depression   . Hypothyroid   . Atrial fibrillation     Paroxysmal, at times is severe illness in the past  . S/P IVC filter     Placed 2000 with severe illness  . Mitral regurgitation     Mild  . Chest pain     Nuclear, 2003, no ischemia  .  Ejection fraction     EF 60%, echo, December, 2008  . Diastolic dysfunction   . Aortic insufficiency     Mild, echo, 2008  . Foot deformity     Congenitally abnormal left foot, multiple operations over time  . 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 IBS (irritable bowel syndrome)     Past Surgical History  Procedure Laterality Date  . Appendectomy    . Tonsillectomy    . Foot surgery      left foot multiple surgeries  . Bladder tack      Patient Active Problem List   Diagnosis Date Noted  . S/P IVC filter     Priority: High  . Chest pain     Priority: High  . Ejection fraction     Priority: High  . Diastolic dysfunction     Priority: High  . IBS (irritable bowel syndrome)   . CVA (cerebral vascular accident)   . Anxiety   . Hypothyroid   . Atrial fibrillation   . Mitral regurgitation   . Aortic insufficiency   . Foot deformity   . OSTEOPOROSIS 07/16/2008  . CONSTIPATION, CHRONIC 03/29/2008  . KNEE  PAIN, RIGHT 03/29/2008  . UNSPECIFIED HYPOTHYROIDISM 01/15/2008  . DEPRESSION/ANXIETY 01/15/2008  . CVA WITH LEFT HEMIPARESIS 01/15/2008  . ADULT RESPIRATORY DISTRESS SYNDROME 01/15/2008  . ACUTE APPENDICITIS WITH GENERALIZED PERITONITIS 01/15/2008  . BACK PAIN, CHRONIC 01/15/2008    ROS   Patient denies fever, chills, headache, sweats, rash, change in vision, change in hearing, chest pain, cough, urinary symptoms. All other systems are reviewed and are negative.  PHYSICAL EXAM   Patient is here with a helper. Patient is oriented to person time and place. She has deformity of her foot. Lungs are clear. Respiratory effort is nonlabored. Cardiac exam reveals S1 and S2. There no clicks or significant murmurs. The abdomen is soft. Is no peripheral edema.  Filed Vitals:   03/17/13 1247  BP: 124/64  Pulse: 82  Height: 5' 6.5" (1.689 m)  Weight: 109 lb (49.442 kg)   EKG is done today and reviewed by me. There is normal sinus rhythm. There is no change from prior tracing.  ASSESSMENT & PLAN

## 2013-03-17 NOTE — Assessment & Plan Note (Signed)
The patient had mild aortic insufficiency in the past. There is no sign of progression by physical exam. She does not need a followup echo at this time.

## 2013-03-17 NOTE — Assessment & Plan Note (Signed)
She's not having any significant chest pain. No further workup. 

## 2013-03-17 NOTE — ED Notes (Signed)
Pt states that she has hardware in her left great toe and has been having pain in and has an open sore that keeps bleeding on that toe, tonight toe became warm and she noticed increased drainage, which she attempted to cleanse and brought with her in a zip lock bag

## 2013-03-17 NOTE — Patient Instructions (Signed)
Your physician recommends that you continue on your current medications as directed. Please refer to the Current Medication list given to you today.  Your physician wants you to follow-up in: 1 year. You will receive a reminder letter in the mail two months in advance. If you don't receive a letter, please call our office to schedule the follow-up appointment.  

## 2013-03-17 NOTE — ED Provider Notes (Signed)
CSN: [id number]     Arrival date & time 03/17/13  2005 History  This chart was scribed for Geoffery Lyons, MD by Dorothey Baseman, ED Scribe. This patient was seen in room MH09/MH09 and the patient's care was started at 8:23 PM.    Chief Complaint  Patient presents with  . Foot Pain   The history is provided by the patient. No language interpreter was used.   HPI Comments: [name] is a 72 y.o. female with a history of triple arthrocentesis to the left toes (performed in [place] in 2001) who presents to the Emergency Department complaining of a constant pain to the left great toe with an associated, open sore that has been persistently bleeding with associated warmth and increased, yellow-colored drainage from the area onset earlier tonight. Patient reports that she has "hardware" in the left foot, especially in the great toe secondary to the foot surgery. She denies any current antibiotic use, but was started on a course of Keflex in October, 2014, with relief. Patient reports a history of CVA, DIC, and cardiac problems.  Past Medical History  Diagnosis Date  . CVA (cerebral vascular accident)     Left hemiparesis, at the time of critical illness in the past  . ARDS (adult respiratory distress syndrome)   . DIC (disseminated intravascular coagulation)   . Anxiety   . Depression   . Hypothyroid   . Atrial fibrillation     Paroxysmal, at times is severe illness in the past  . S/P IVC filter     Placed 2000 with severe illness  . Mitral regurgitation     Mild  . Chest pain     Nuclear, 2003, no ischemia  . Ejection fraction     EF 60%, echo, December, 2008  . Diastolic dysfunction   . Aortic insufficiency     Mild, echo, 2008  . Foot deformity     Congenitally abnormal left foot, multiple operations over time  . 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 IBS (irritable bowel syndrome)    Past Surgical History  Procedure Laterality Date  .  Appendectomy    . Tonsillectomy    . Foot surgery      left foot multiple surgeries  . Bladder tack     History reviewed. No pertinent family history. History  Substance Use Topics  . Smoking status: Former Games developer  . Smokeless tobacco: Never Used  . Alcohol Use: Yes     Comment: occasional glass of wine   OB History   Grav Para Term Preterm Abortions TAB SAB Ect Mult Living                 Review of Systems  A complete 10 system review of systems was obtained and all systems are negative except as noted in the HPI and PMH.   Allergies  Review of patient's allergies indicates no known allergies.  Home Medications   Current Outpatient Rx  Name  Route  Sig  Dispense  Refill  . alendronate (FOSAMAX) 70 MG tablet   Oral   Take 70 mg by mouth every 7 (seven) days. Take with a full glass of water on an empty stomach.         . Calcium Carbonate-Vitamin D (CALCIUM-VITAMIN D) 500-200 MG-UNIT per tablet   Oral   Take 1 tablet by mouth daily.          Marland Kitchen  cephALEXin (KEFLEX) 500 MG capsule   Oral   Take 1 capsule (500 mg total) by mouth 4 (four) times daily.   28 capsule   0   . clonazePAM (KLONOPIN) 1 MG tablet   Oral   Take 1 mg by mouth 4 (four) times daily as needed. For anxiety         . dicyclomine (BENTYL) 20 MG tablet   Oral   Take 1 tablet (20 mg total) by mouth 2 (two) times daily.   20 tablet   0   . gabapentin (NEURONTIN) 100 MG capsule   Oral   Take 100 mg by mouth 4 (four) times daily.          Marland Kitchen levothyroxine (SYNTHROID, LEVOTHROID) 75 MCG tablet   Oral   Take 75 mcg by mouth daily.          Marland Kitchen omeprazole (PRILOSEC) 20 MG capsule   Oral   Take 20 mg by mouth daily.          Marland Kitchen UNABLE TO FIND      rx ointment to foot-unsure of name         . venlafaxine XR (EFFEXOR-XR) 75 MG 24 hr capsule               . zolpidem (AMBIEN CR) 6.25 MG CR tablet   Oral   Take 3.125 mg by mouth at bedtime as needed. For sleep         .  polymixin-bacitracin (POLYSPORIN) 500-10000 UNIT/GM OINT ointment   Topical   Apply 1 application topically 2 (two) times daily.   1 Tube   1    Triage Vitals: BP 119/69  Pulse 98  Temp(Src) 97.9 F (36.6 C) (Oral)  Resp 16  Ht 5\' 6"  (1.676 m)  Wt 110 lb (49.896 kg)  BMI 17.76 kg/m2  SpO2 100%  Physical Exam  Nursing note and vitals reviewed. Constitutional: She is oriented to person, place, and time. She appears well-developed and well-nourished. No distress.  HENT:  Head: Normocephalic and atraumatic.  Eyes: Conjunctivae are normal.  Neck: Normal range of motion. Neck supple.  Pulmonary/Chest: Effort normal. No respiratory distress.  Abdominal: She exhibits no distension.  Musculoskeletal: Normal range of motion.  Neurological: She is alert and oriented to person, place, and time.  Skin: Skin is warm and dry. There is erythema.  Right great toe is status post fusion of the MTP joint. There is a small ulcer present over the MTP joint with surrounding erythema and scant, purulent drainage.   Psychiatric: She has a normal mood and affect. Her behavior is normal.    ED Course  Procedures (including critical care time)  DIAGNOSTIC STUDIES: Oxygen Saturation is 100% on room air, normal by my interpretation.    COORDINATION OF CARE: 8:28 PM- Advised patient to follow up with the referred orthopaedist. Will discharge patient with Keflex to manage symptoms. Discussed treatment plan with patient at bedside and patient verbalized agreement.     Labs Review Labs Reviewed - No data to display Imaging Review No results found.  EKG Interpretation   None       MDM  No diagnosis found. Patient with history of fusion of the first metatarsal phalangeal joint. She has had chronic cellulitis and infection in the toe for several months. She's been off and on antibiotics. She stated is now becoming more red and draining pus. She does not have a local orthopedist as the surgery was  done  in [place] and she recently relocated here. She will be given Keflex and the followup information for the orthopedist on call to arrange a followup appointment.  I personally performed the services described in this documentation, which was scribed in my presence. The recorded information has been reviewed and is accurate.       Geoffery Lyons, MD 03/17/13 2306

## 2013-04-23 ENCOUNTER — Encounter (HOSPITAL_BASED_OUTPATIENT_CLINIC_OR_DEPARTMENT_OTHER): Payer: Self-pay | Admitting: Emergency Medicine

## 2013-04-23 ENCOUNTER — Emergency Department (HOSPITAL_BASED_OUTPATIENT_CLINIC_OR_DEPARTMENT_OTHER)
Admission: EM | Admit: 2013-04-23 | Discharge: 2013-04-23 | Disposition: A | Discharge status: Home / Self Care | Payer: Medicare Other | Attending: Emergency Medicine | Admitting: Emergency Medicine

## 2013-04-23 ENCOUNTER — Emergency Department (HOSPITAL_BASED_OUTPATIENT_CLINIC_OR_DEPARTMENT_OTHER): Payer: Medicare Other

## 2013-04-23 DIAGNOSIS — Z8669 Personal history of other diseases of the nervous system and sense organs: Secondary | ICD-10-CM | POA: Insufficient documentation

## 2013-04-23 DIAGNOSIS — Z8739 Personal history of other diseases of the musculoskeletal system and connective tissue: Secondary | ICD-10-CM | POA: Insufficient documentation

## 2013-04-23 DIAGNOSIS — S42293A Other displaced fracture of upper end of unspecified humerus, initial encounter for closed fracture: Secondary | ICD-10-CM | POA: Insufficient documentation

## 2013-04-23 DIAGNOSIS — Y939 Activity, unspecified: Secondary | ICD-10-CM | POA: Insufficient documentation

## 2013-04-23 DIAGNOSIS — W19XXXA Unspecified fall, initial encounter: Secondary | ICD-10-CM

## 2013-04-23 DIAGNOSIS — S42292A Other displaced fracture of upper end of left humerus, initial encounter for closed fracture: Secondary | ICD-10-CM

## 2013-04-23 DIAGNOSIS — F411 Generalized anxiety disorder: Secondary | ICD-10-CM | POA: Insufficient documentation

## 2013-04-23 DIAGNOSIS — Y929 Unspecified place or not applicable: Secondary | ICD-10-CM | POA: Insufficient documentation

## 2013-04-23 DIAGNOSIS — Z862 Personal history of diseases of the blood and blood-forming organs and certain disorders involving the immune mechanism: Secondary | ICD-10-CM | POA: Insufficient documentation

## 2013-04-23 DIAGNOSIS — Z8679 Personal history of other diseases of the circulatory system: Secondary | ICD-10-CM | POA: Insufficient documentation

## 2013-04-23 DIAGNOSIS — Z792 Long term (current) use of antibiotics: Secondary | ICD-10-CM | POA: Insufficient documentation

## 2013-04-23 DIAGNOSIS — Z8719 Personal history of other diseases of the digestive system: Secondary | ICD-10-CM | POA: Insufficient documentation

## 2013-04-23 DIAGNOSIS — W1809XA Striking against other object with subsequent fall, initial encounter: Secondary | ICD-10-CM | POA: Insufficient documentation

## 2013-04-23 DIAGNOSIS — F3289 Other specified depressive episodes: Secondary | ICD-10-CM | POA: Insufficient documentation

## 2013-04-23 DIAGNOSIS — Z79899 Other long term (current) drug therapy: Secondary | ICD-10-CM | POA: Insufficient documentation

## 2013-04-23 DIAGNOSIS — E039 Hypothyroidism, unspecified: Secondary | ICD-10-CM | POA: Insufficient documentation

## 2013-04-23 DIAGNOSIS — Z87891 Personal history of nicotine dependence: Secondary | ICD-10-CM | POA: Insufficient documentation

## 2013-04-23 DIAGNOSIS — F329 Major depressive disorder, single episode, unspecified: Secondary | ICD-10-CM | POA: Insufficient documentation

## 2013-04-23 DIAGNOSIS — Z8673 Personal history of transient ischemic attack (TIA), and cerebral infarction without residual deficits: Secondary | ICD-10-CM | POA: Insufficient documentation

## 2013-04-23 MED ORDER — OXYCODONE-ACETAMINOPHEN 5-325 MG PO TABS
ORAL_TABLET | ORAL | Status: AC
  Filled 2013-04-23: qty 1

## 2013-04-23 MED ORDER — TRAMADOL HCL 50 MG PO TABS
50.0000 mg | ORAL_TABLET | Freq: Once | ORAL | Status: AC
  Administered 2013-04-23: 50 mg via ORAL
  Filled 2013-04-23: qty 1

## 2013-04-23 MED ORDER — OXYCODONE-ACETAMINOPHEN 5-325 MG PO TABS
1.0000 | ORAL_TABLET | Freq: Once | ORAL | Status: AC
  Administered 2013-04-23: 1 via ORAL

## 2013-04-23 MED ORDER — OXYCODONE-ACETAMINOPHEN 5-325 MG PO TABS: 1.0000 | ORAL_TABLET | Freq: Four times a day (QID) | ORAL | Status: DC | PRN

## 2013-04-23 NOTE — ED Notes (Signed)
Report called to [facility] and spoke with York Cerise.  [name] stated that we need to call our social worker and have them complete an FL2 so the patient can go straight to Healthcare and that they will have a room ready for her.  Patient is going to need assistance due to patient's use of a walker for all ambulation and now has no use of her left arm.  Call placed to social worker and message left on voice mail.

## 2013-04-23 NOTE — ED Provider Notes (Signed)
CSN: [id number]     Arrival date & time 04/23/13  0736 History   First MD Initiated Contact with Patient 04/23/13 0737     Chief Complaint  Patient presents with  . Fall    shoulder pain left   (Consider location/radiation/quality/duration/timing/severity/associated sxs/prior Treatment) Patient is a 72 y.o. female presenting with fall. The history is provided by the patient.  Fall This is a new problem. The current episode started 6 to 12 hours ago. Episode frequency: once. The problem has been resolved. Pertinent negatives include no chest pain, no abdominal pain, no headaches and no shortness of breath. Nothing aggravates the symptoms. Nothing relieves the symptoms. She has tried nothing for the symptoms. The treatment provided no relief.    Past Medical History  Diagnosis Date  . CVA (cerebral vascular accident)     Left hemiparesis, at the time of critical illness in the past  . ARDS (adult respiratory distress syndrome)   . DIC (disseminated intravascular coagulation)   . Anxiety   . Depression   . Hypothyroid   . Atrial fibrillation     Paroxysmal, at times is severe illness in the past  . S/P IVC filter     Placed 2000 with severe illness  . Mitral regurgitation     Mild  . Chest pain     Nuclear, 2003, no ischemia  . Ejection fraction     EF 60%, echo, December, 2008  . Diastolic dysfunction   . Aortic insufficiency     Mild, echo, 2008  . Foot deformity     Congenitally abnormal left foot, multiple operations over time  . 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 IBS (irritable bowel syndrome)    Past Surgical History  Procedure Laterality Date  . Appendectomy    . Tonsillectomy    . Foot surgery      left foot multiple surgeries  . Bladder tack     No family history on file. History  Substance Use Topics  . Smoking status: Former Games developer  . Smokeless tobacco: Never Used  . Alcohol Use: Yes     Comment: occasional  glass of wine   OB History   Grav Para Term Preterm Abortions TAB SAB Ect Mult Living                 Review of Systems  Constitutional: Negative for fever and fatigue.  HENT: Negative for congestion and drooling.   Eyes: Negative for pain.  Respiratory: Negative for cough and shortness of breath.   Cardiovascular: Negative for chest pain.  Gastrointestinal: Negative for nausea, vomiting, abdominal pain and diarrhea.  Genitourinary: Negative for dysuria and hematuria.  Musculoskeletal: Negative for back pain, gait problem and neck pain.  Skin: Negative for color change.  Neurological: Negative for dizziness and headaches.  Hematological: Negative for adenopathy.  Psychiatric/Behavioral: Negative for behavioral problems.  All other systems reviewed and are negative.    Allergies  Review of patient's allergies indicates no known allergies.  Home Medications   Current Outpatient Rx  Name  Route  Sig  Dispense  Refill  . alendronate (FOSAMAX) 70 MG tablet   Oral   Take 70 mg by mouth every 7 (seven) days. Take with a full glass of water on an empty stomach.         . Calcium Carbonate-Vitamin D (CALCIUM-VITAMIN D) 500-200 MG-UNIT per tablet   Oral   Take 1  tablet by mouth daily.          . clonazePAM (KLONOPIN) 1 MG tablet   Oral   Take 1 mg by mouth 4 (four) times daily as needed. For anxiety         . gabapentin (NEURONTIN) 100 MG capsule   Oral   Take 100 mg by mouth 4 (four) times daily.          Marland Kitchen levothyroxine (SYNTHROID, LEVOTHROID) 75 MCG tablet   Oral   Take 75 mcg by mouth daily.          Marland Kitchen omeprazole (PRILOSEC) 20 MG capsule   Oral   Take 20 mg by mouth daily.          Marland Kitchen UNABLE TO FIND      rx ointment to foot-unsure of name         . venlafaxine XR (EFFEXOR-XR) 75 MG 24 hr capsule               . zolpidem (AMBIEN CR) 6.25 MG CR tablet   Oral   Take 3.125 mg by mouth at bedtime as needed. For sleep         . cephALEXin  (KEFLEX) 500 MG capsule   Oral   Take 1 capsule (500 mg total) by mouth 4 (four) times daily.   28 capsule   0   . cephALEXin (KEFLEX) 500 MG capsule   Oral   Take 1 capsule (500 mg total) by mouth 3 (three) times daily.   30 capsule   0   . dicyclomine (BENTYL) 20 MG tablet   Oral   Take 1 tablet (20 mg total) by mouth 2 (two) times daily.   20 tablet   0   . polymixin-bacitracin (POLYSPORIN) 500-10000 UNIT/GM OINT ointment   Topical   Apply 1 application topically 2 (two) times daily.   1 Tube   1    BP 145/72  Pulse 78  Temp(Src) 97.9 F (36.6 C) (Oral)  Resp 20  SpO2 93% Physical Exam  Nursing note and vitals reviewed. Constitutional: She is oriented to person, place, and time. She appears well-developed and well-nourished.  HENT:  Head: Normocephalic.  Mouth/Throat: Oropharynx is clear and moist. No oropharyngeal exudate.  Eyes: Conjunctivae and EOM are normal. Pupils are equal, round, and reactive to light.  Neck: Normal range of motion. Neck supple.  No vertebral ttp.   Cardiovascular: Normal rate, regular rhythm, normal heart sounds and intact distal pulses.  Exam reveals no gallop and no friction rub.   No murmur heard. Pulmonary/Chest: Effort normal and breath sounds normal. No respiratory distress. She has no wheezes.  Abdominal: Soft. Bowel sounds are normal. There is no tenderness. There is no rebound and no guarding.  Musculoskeletal: Normal range of motion. She exhibits tenderness (Mild left lateral shoulder ttp). She exhibits no edema.  Normal rom of hips.   2+ distal pulses.   Neurological: She is alert and oriented to person, place, and time.  Skin: Skin is warm and dry.  Psychiatric: She has a normal mood and affect. Her behavior is normal.    ED Course  Procedures (including critical care time) Labs Review Labs Reviewed  URINALYSIS W MICROSCOPIC + REFLEX CULTURE   Imaging Review Dg Chest 1 View  04/23/2013   CLINICAL DATA:  Status post  fall complaining of left shoulder pain.  EXAM: CHEST - 1 VIEW  COMPARISON:  May 27, 2011  FINDINGS: The heart size and mediastinal  contours are stable. There is no focal infiltrate, pulmonary edema, or pleural effusion. Small calcific density is identified in the left lung base probably due to confluence of rib end and overlying vessel. The visualized skeletal structures are stable. There is chronic deformity of the left lateral 9th rib unchanged.  IMPRESSION: No active cardiopulmonary disease.   Electronically Signed   By: Sherian Rein M.D.   On: 04/23/2013 09:08   Dg Pelvis 1-2 Views  04/23/2013   CLINICAL DATA:  Status post fall  EXAM: PELVIS - 1-2 VIEW  COMPARISON:  02/23/2013  FINDINGS: There is no evidence of fracture, dislocation or malalignment. Spondylolysis is identified within the lower lumbar spine. Periarticular sclerosis identified within the sacroiliac regions. Air is seen within nondilated loops of large small bowel with a moderate to large amount of stool.  IMPRESSION: No evidence of acute osseous abnormalities.  Osteoarthritic changes.   Electronically Signed   By: Salome Holmes M.D.   On: 04/23/2013 09:09   Dg Shoulder Left  04/23/2013   CLINICAL DATA:  Status post fall, left shoulder pain.  EXAM: LEFT SHOULDER - 2+ VIEW  COMPARISON:  None.  FINDINGS: Comminuted impacted humeral head fracture is appreciated with lateral angulation. Fracture otherwise appears nondisplaced.  IMPRESSION: Humeral head fracture.   Electronically Signed   By: Salome Holmes M.D.   On: 04/23/2013 09:08   Dg Humerus Left  04/23/2013   CLINICAL DATA:  Status post fall, shoulder pain  EXAM: LEFT HUMERUS - 2+ VIEW  COMPARISON:  None  FINDINGS: A mildly comminuted, impacted humeral head fracture is appreciated. There is a component of mild lateral angulation and mild medial displacement.  IMPRESSION: Humeral head fracture.   Electronically Signed   By: Salome Holmes M.D.   On: 04/23/2013 09:11    EKG  Interpretation   None       MDM   1. Humeral head fracture, left, closed, initial encounter   2. Fall, initial encounter    8:26 AM 72 y.o. female who presents with mechanical fall which occurred in her bathroom at 10 PM last night. The patient states she was getting up from a seated position when she fell to the side hitting her left shoulder against vanity. She denies hitting her head or loss of consciousness. She states that the shoulder pain continued throughout the night and presents now for evaluation. She is afebrile and vital signs are unremarkable here. She denies hitting her head and has no vertebral pain. Will get screening imaging and urinalysis.  10:16 AM: Discussed case w/ Dr. Shelle Iron. Will have pt f/u w/ him tomorrow. Will place in sling/swathe and recommend using squeeze ball at home and icing shoulder.  I have discussed the diagnosis/risks/treatment options with the patient and believe the pt to be eligible for discharge home to follow-up with Dr. Shelle Iron tomorrow. We also discussed returning to the ED immediately if new or worsening sx occur. We discussed the sx which are most concerning (e.g., worsening pain) that necessitate immediate return. Any new prescriptions provided to the patient are listed below.  New Prescriptions   OXYCODONE-ACETAMINOPHEN (PERCOCET) 5-325 MG PER TABLET    Take 1 tablet by mouth every 6 (six) hours as needed for moderate pain.       Junius Argyle, MD 04/23/13 1058

## 2013-04-23 NOTE — ED Notes (Signed)
EMS reports Mrs. [name] is a resident at [facility].  States she fell this morning injuring her left shoulder.  Denies loc.  Positive deformity to left shoulder.  IV inserted in the right hand, 22 g angiocath.  Pt received 250 mg Fentanyl IV for pain.

## 2013-04-23 NOTE — ED Notes (Signed)
Red socks & yellow fall risk band put on patient.

## 2013-04-23 NOTE — Progress Notes (Addendum)
CSW received consult from [facility] regarding patient needing skilled nursing placement. Pt is a resident at [facility] and uses a walker 100% of the time to ambulate. Patient has shoulder fracture and unable to complete adl's, and unable to use her walker. CSW completed fl2 for MD signature and [facility] number. CSW faxed fl2 to be signed by the MD. CSW confirmed with Terri at [facility] she had received all information needed for admisison to snf. Per Terri she will have Dr. Romeo Apple sign the fl2. Per Terri patient will be transported by [facility] and it has already been arranged. Per Terri she will get hard rx for patient pain medication. CSW confirmed with Grayce Sessions they are ready for patient to be admitted to snf.  Catha Gosselin, LCSW [phone]  ED CSW .04/23/2013 1116am   [facility], requesting signed fl2 and avs to be faxed to [phone]. RN Terri agreed to fax this clinicals to [facility].  Catha Gosselin, LCSW [phone]  ED CSW .04/23/2013 1134

## 2013-12-09 ENCOUNTER — Emergency Department (HOSPITAL_BASED_OUTPATIENT_CLINIC_OR_DEPARTMENT_OTHER)
Admission: EM | Admit: 2013-12-09 | Discharge: 2013-12-09 | Disposition: A | Discharge status: Home / Self Care | Payer: Medicare Other | Attending: Emergency Medicine | Admitting: Emergency Medicine

## 2013-12-09 ENCOUNTER — Encounter (HOSPITAL_BASED_OUTPATIENT_CLINIC_OR_DEPARTMENT_OTHER): Payer: Self-pay | Admitting: Emergency Medicine

## 2013-12-09 DIAGNOSIS — E039 Hypothyroidism, unspecified: Secondary | ICD-10-CM | POA: Insufficient documentation

## 2013-12-09 DIAGNOSIS — Z862 Personal history of diseases of the blood and blood-forming organs and certain disorders involving the immune mechanism: Secondary | ICD-10-CM | POA: Insufficient documentation

## 2013-12-09 DIAGNOSIS — M79609 Pain in unspecified limb: Secondary | ICD-10-CM | POA: Insufficient documentation

## 2013-12-09 DIAGNOSIS — Z8709 Personal history of other diseases of the respiratory system: Secondary | ICD-10-CM | POA: Diagnosis not present

## 2013-12-09 DIAGNOSIS — Z8739 Personal history of other diseases of the musculoskeletal system and connective tissue: Secondary | ICD-10-CM | POA: Insufficient documentation

## 2013-12-09 DIAGNOSIS — F411 Generalized anxiety disorder: Secondary | ICD-10-CM | POA: Insufficient documentation

## 2013-12-09 DIAGNOSIS — F3289 Other specified depressive episodes: Secondary | ICD-10-CM | POA: Insufficient documentation

## 2013-12-09 DIAGNOSIS — K589 Irritable bowel syndrome without diarrhea: Secondary | ICD-10-CM | POA: Diagnosis not present

## 2013-12-09 DIAGNOSIS — Z7983 Long term (current) use of bisphosphonates: Secondary | ICD-10-CM | POA: Insufficient documentation

## 2013-12-09 DIAGNOSIS — Z79899 Other long term (current) drug therapy: Secondary | ICD-10-CM | POA: Insufficient documentation

## 2013-12-09 DIAGNOSIS — Z8673 Personal history of transient ischemic attack (TIA), and cerebral infarction without residual deficits: Secondary | ICD-10-CM | POA: Insufficient documentation

## 2013-12-09 DIAGNOSIS — I5030 Unspecified diastolic (congestive) heart failure: Secondary | ICD-10-CM | POA: Insufficient documentation

## 2013-12-09 DIAGNOSIS — L02619 Cutaneous abscess of unspecified foot: Secondary | ICD-10-CM | POA: Insufficient documentation

## 2013-12-09 DIAGNOSIS — F329 Major depressive disorder, single episode, unspecified: Secondary | ICD-10-CM | POA: Diagnosis not present

## 2013-12-09 DIAGNOSIS — L03116 Cellulitis of left lower limb: Secondary | ICD-10-CM | POA: Diagnosis present

## 2013-12-09 DIAGNOSIS — Z87891 Personal history of nicotine dependence: Secondary | ICD-10-CM | POA: Insufficient documentation

## 2013-12-09 DIAGNOSIS — Z792 Long term (current) use of antibiotics: Secondary | ICD-10-CM | POA: Insufficient documentation

## 2013-12-09 DIAGNOSIS — L03119 Cellulitis of unspecified part of limb: Principal | ICD-10-CM

## 2013-12-09 MED ORDER — CLINDAMYCIN HCL 150 MG PO CAPS
450.0000 mg | ORAL_CAPSULE | Freq: Once | ORAL | Status: AC
  Administered 2013-12-09: 450 mg via ORAL
  Filled 2013-12-09: qty 3

## 2013-12-09 MED ORDER — CLINDAMYCIN HCL 300 MG PO CAPS: 300.0000 mg | ORAL_CAPSULE | Freq: Three times a day (TID) | ORAL | Status: DC

## 2013-12-09 NOTE — ED Notes (Signed)
Left foot pain, swelling, " and hot" x today-hx of multiple surgeries to foot-last 2004

## 2013-12-09 NOTE — ED Notes (Signed)
Left foot swelling, redness onset Monday  Denies inj

## 2013-12-09 NOTE — ED Provider Notes (Signed)
CSN: [id number]     Arrival date & time 12/09/13  1956 History   First MD Initiated Contact with Patient 12/09/13 2014     This chart was scribed for Junius Argyle, MD by Arlan Organ, ED Scribe. This patient was seen in room MH04/MH04 and the patient's care was started 8:20 PM.   Chief Complaint  Patient presents with  . Foot Pain   Patient is a 73 y.o. female presenting with lower extremity pain. The history is provided by the patient. No language interpreter was used.  Foot Pain This is a new problem. The current episode started yesterday. The problem occurs constantly. The problem has been gradually worsening. Pertinent negatives include no chest pain, no abdominal pain, no headaches and no shortness of breath. Nothing aggravates the symptoms. Nothing relieves the symptoms. She has tried nothing for the symptoms.    HPI Comments: [name] is a 73 y.o. Female with a PMHx of CVA, A-Fib, and Hypothyroid who presents to the Emergency Department complaining of constant, mild L foot pain onset yesterday that has progressively worsened today. No recent injury or trauma. States she is in very little pain at this time, however, she states the area feels "tight". She has noted swelling, warmth, and redness to the area. She mentions a gradual progression of a "hammer toe" to the L great toe over the last few months. She has not tried anything OTC to help manage symptoms. At this time she denies any fever, chills, diarrhea, SOB, or CP. Pt reports several surgeries on the L foot since 2004. No known allergies to medications. No other concerns this visit.  Past Medical History  Diagnosis Date  . CVA (cerebral vascular accident)     Left hemiparesis, at the time of critical illness in the past  . ARDS (adult respiratory distress syndrome)   . DIC (disseminated intravascular coagulation)   . Anxiety   . Depression   . Hypothyroid   . Atrial fibrillation     Paroxysmal, at times is severe illness  in the past  . S/P IVC filter     Placed 2000 with severe illness  . Mitral regurgitation     Mild  . Chest pain     Nuclear, 2003, no ischemia  . Ejection fraction     EF 60%, echo, December, 2008  . Diastolic dysfunction   . Aortic insufficiency     Mild, echo, 2008  . Foot deformity     Congenitally abnormal left foot, multiple operations over time  . 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 IBS (irritable bowel syndrome)    Past Surgical History  Procedure Laterality Date  . Appendectomy    . Tonsillectomy    . Foot surgery      left foot multiple surgeries  . Bladder tack     No family history on file. History  Substance Use Topics  . Smoking status: Former Games developer  . Smokeless tobacco: Never Used  . Alcohol Use: Yes     Comment: occasional glass of wine   OB History   Grav Para Term Preterm Abortions TAB SAB Ect Mult Living                 Review of Systems  Constitutional: Negative for fever and chills.  HENT: Negative for rhinorrhea and sore throat.   Eyes: Negative for visual disturbance.  Respiratory: Negative for cough and shortness of  breath.   Cardiovascular: Negative for chest pain and leg swelling.  Gastrointestinal: Negative for nausea, vomiting, abdominal pain and diarrhea.  Genitourinary: Negative for dysuria.  Musculoskeletal: Positive for arthralgias (L foot). Negative for back pain and neck pain.  Skin: Negative for rash.  Neurological: Negative for dizziness, light-headedness and headaches.  Hematological: Does not bruise/bleed easily.  Psychiatric/Behavioral: Negative for confusion.      Allergies  Review of patient's allergies indicates no known allergies.  Home Medications   Prior to Admission medications   Medication Sig Start Date End Date Taking? Authorizing Provider  alendronate (FOSAMAX) 70 MG tablet Take 70 mg by mouth every 7 (seven) days. Take with a full glass of water on an empty  stomach.    Historical Provider, MD  Calcium Carbonate-Vitamin D (CALCIUM-VITAMIN D) 500-200 MG-UNIT per tablet Take 1 tablet by mouth daily.     Historical Provider, MD  cephALEXin (KEFLEX) 500 MG capsule Take 1 capsule (500 mg total) by mouth 4 (four) times daily. 02/23/13   Shanna CiscoMegan E Docherty, MD  cephALEXin (KEFLEX) 500 MG capsule Take 1 capsule (500 mg total) by mouth 3 (three) times daily. 03/17/13   Geoffery Lyonsouglas Delo, MD  clonazePAM (KLONOPIN) 1 MG tablet Take 1 mg by mouth 4 (four) times daily as needed. For anxiety    Historical Provider, MD  dicyclomine (BENTYL) 20 MG tablet Take 1 tablet (20 mg total) by mouth 2 (two) times daily. 02/23/13   Shanna CiscoMegan E Docherty, MD  gabapentin (NEURONTIN) 100 MG capsule Take 100 mg by mouth 4 (four) times daily.  01/26/12   Historical Provider, MD  levothyroxine (SYNTHROID, LEVOTHROID) 75 MCG tablet Take 75 mcg by mouth daily.     Historical Provider, MD  omeprazole (PRILOSEC) 20 MG capsule Take 20 mg by mouth daily.  01/17/12   Historical Provider, MD  oxyCODONE-acetaminophen (PERCOCET) 5-325 MG per tablet Take 1 tablet by mouth every 6 (six) hours as needed for moderate pain. 04/23/13   Junius ArgyleForrest S Giovana Faciane, MD  polymixin-bacitracin (POLYSPORIN) 500-10000 UNIT/GM OINT ointment Apply 1 application topically 2 (two) times daily. 06/28/12   Annett Gularacy N McLean, MD  UNABLE TO FIND rx ointment to foot-unsure of name    Historical Provider, MD  venlafaxine XR (EFFEXOR-XR) 75 MG 24 hr capsule  03/12/13   Historical Provider, MD  zolpidem (AMBIEN CR) 6.25 MG CR tablet Take 3.125 mg by mouth at bedtime as needed. For sleep    Historical Provider, MD   Triage Vitals: BP 93/69  Pulse 104  Temp(Src) 98.6 F (37 C) (Oral)  Resp 18  Ht 5\' 6"  (1.676 m)  Wt 110 lb (49.896 kg)  BMI 17.76 kg/m2  SpO2 94%   Physical Exam  Nursing note and vitals reviewed. Constitutional: She is oriented to person, place, and time. She appears well-developed and well-nourished.  HENT:  Head:  Normocephalic and atraumatic.  Mouth/Throat: Oropharynx is clear and moist.  Eyes: Conjunctivae and EOM are normal. Pupils are equal, round, and reactive to light.  Neck: Normal range of motion. Neck supple.  Cardiovascular: Regular rhythm and normal heart sounds.   Pulmonary/Chest: Effort normal and breath sounds normal.  Abdominal: Soft. Bowel sounds are normal. She exhibits no distension. There is no tenderness. There is no rebound and no guarding.  Musculoskeletal: Normal range of motion.  Mild erythema of dorsum of L foot. Normal ROM of L ankle without pain. 2 plus distal pulses in lower extremities.  Neurological: She is alert and oriented to person, place, and  time.  Skin: Skin is warm and dry.  Psychiatric: She has a normal mood and affect. Her behavior is normal.    ED Course  Procedures (including critical care time)  DIAGNOSTIC STUDIES: Oxygen Saturation is 94% on RA, adequate by my interpretation.    COORDINATION OF CARE: 8:29 PM- Will give dose of antibiotic and start on a course after discharge. Advised pt to return if symptoms worsen. Discussed treatment plan with pt at bedside and pt agreed to plan.     Labs Review Labs Reviewed - No data to display  Imaging Review No results found.   EKG Interpretation None        MDM   Final diagnoses:  Cellulitis of left foot    8:50 PM 73 y.o. female here with left foot erythema, tightness, and warmth which began today. Normal range of motion of the ankle without pain. I do not think this is a septic joint. She has had cellulitis several times in the past in this area.  I suspect this is cellulitis again. She denies any injury and declines any pain film imaging. I recommended a rectal temp to see if she had a fever but she declined. She is mildly tachycardic and has a soft blood pressure here. She denies any fatigue, chest pain, shortness of breath, or other associated symptoms. She denies any fevers at home. She states  that she feels fine otherwise. Will give a dose of clindamycin and recommend that she return for any worsening of the redness.  11:17 PM: VS unremarkable upon d/c. I have discussed the diagnosis/risks/treatment options with the patient and believe the pt to be eligible for discharge home to follow-up with her pcp as needed. We also discussed returning to the ED immediately if new or worsening sx occur. We discussed the sx which are most concerning (e.g., worsening pain, spreading redness, fever) that necessitate immediate return. Medications administered to the patient during their visit and any new prescriptions provided to the patient are listed below.  Medications given during this visit Medications  clindamycin (CLEOCIN) capsule 450 mg (450 mg Oral Given 12/09/13 2037)    Discharge Medication List as of 12/09/2013  8:54 PM    START taking these medications   Details  clindamycin (CLEOCIN) 300 MG capsule Take 1 capsule (300 mg total) by mouth 3 (three) times daily., Starting 12/09/2013, Until Discontinued, Print         I personally performed the services described in this documentation, which was scribed in my presence. The recorded information has been reviewed and is accurate.    Junius Argyle, MD 12/09/13 [phone]

## 2014-05-04 IMAGING — CR DG FOOT COMPLETE 3+V*L*
3 series · 3 of 3 positions shown · non-contrast
Comparison: 02/04/2013.

CLINICAL DATA: Left great toe redness.  Previous surgery.

EXAM:
LEFT FOOT - COMPLETE 3+ VIEW

[t foot ap left]
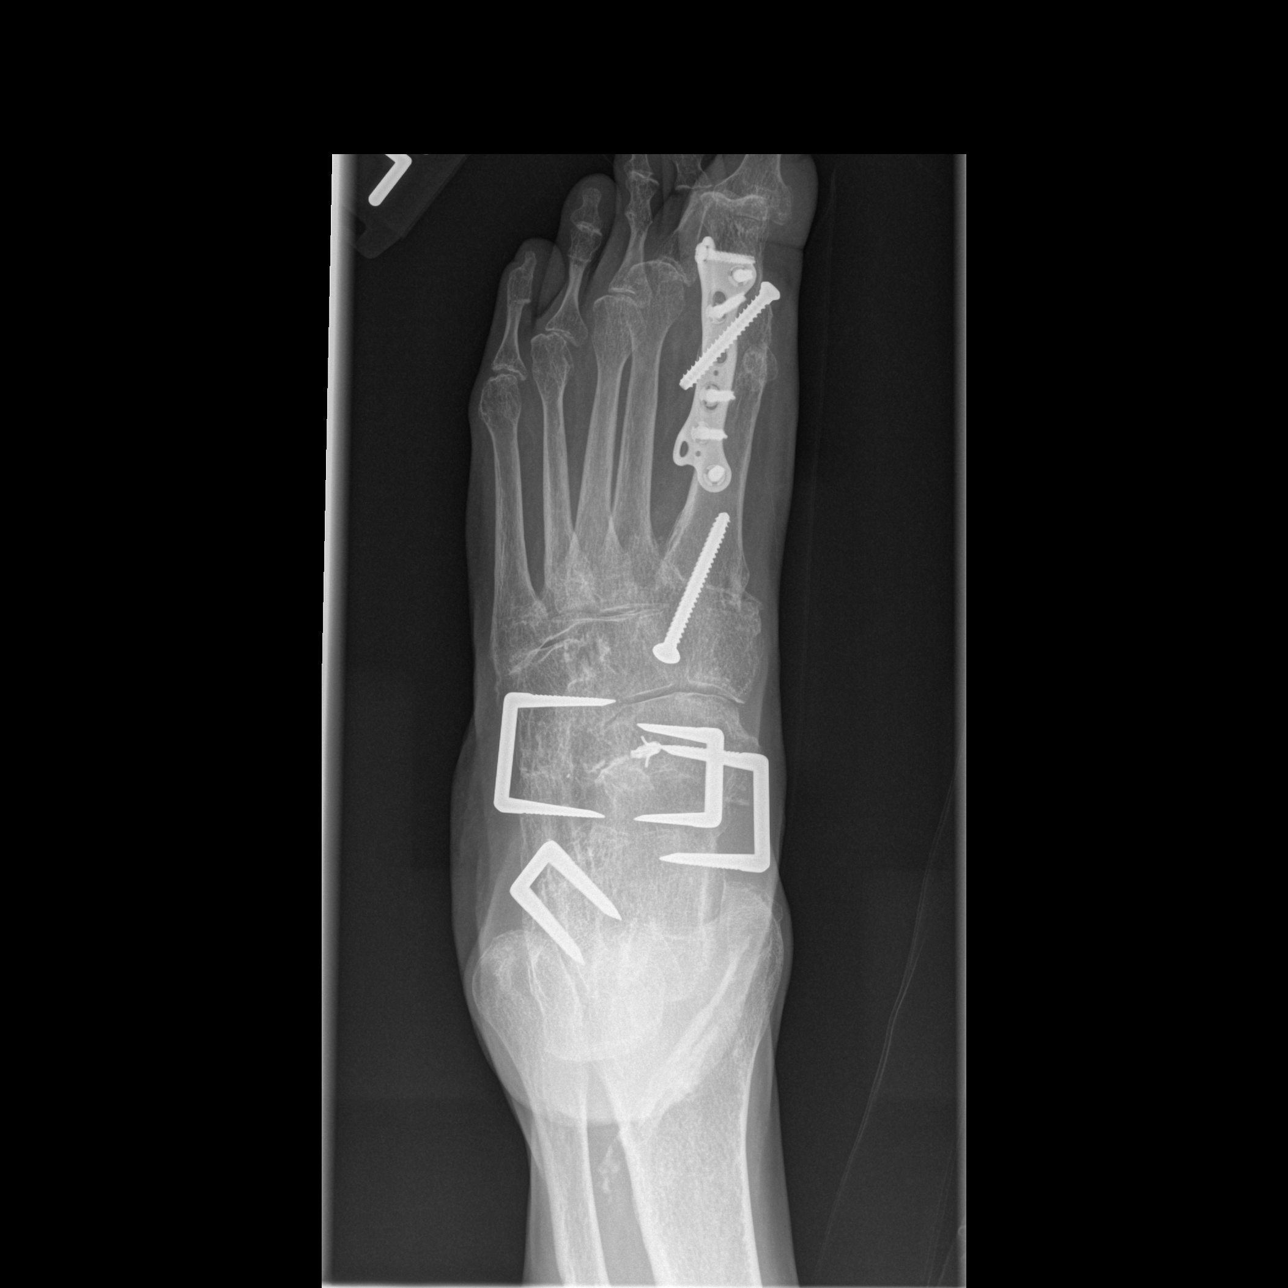

[t foot oblique left]
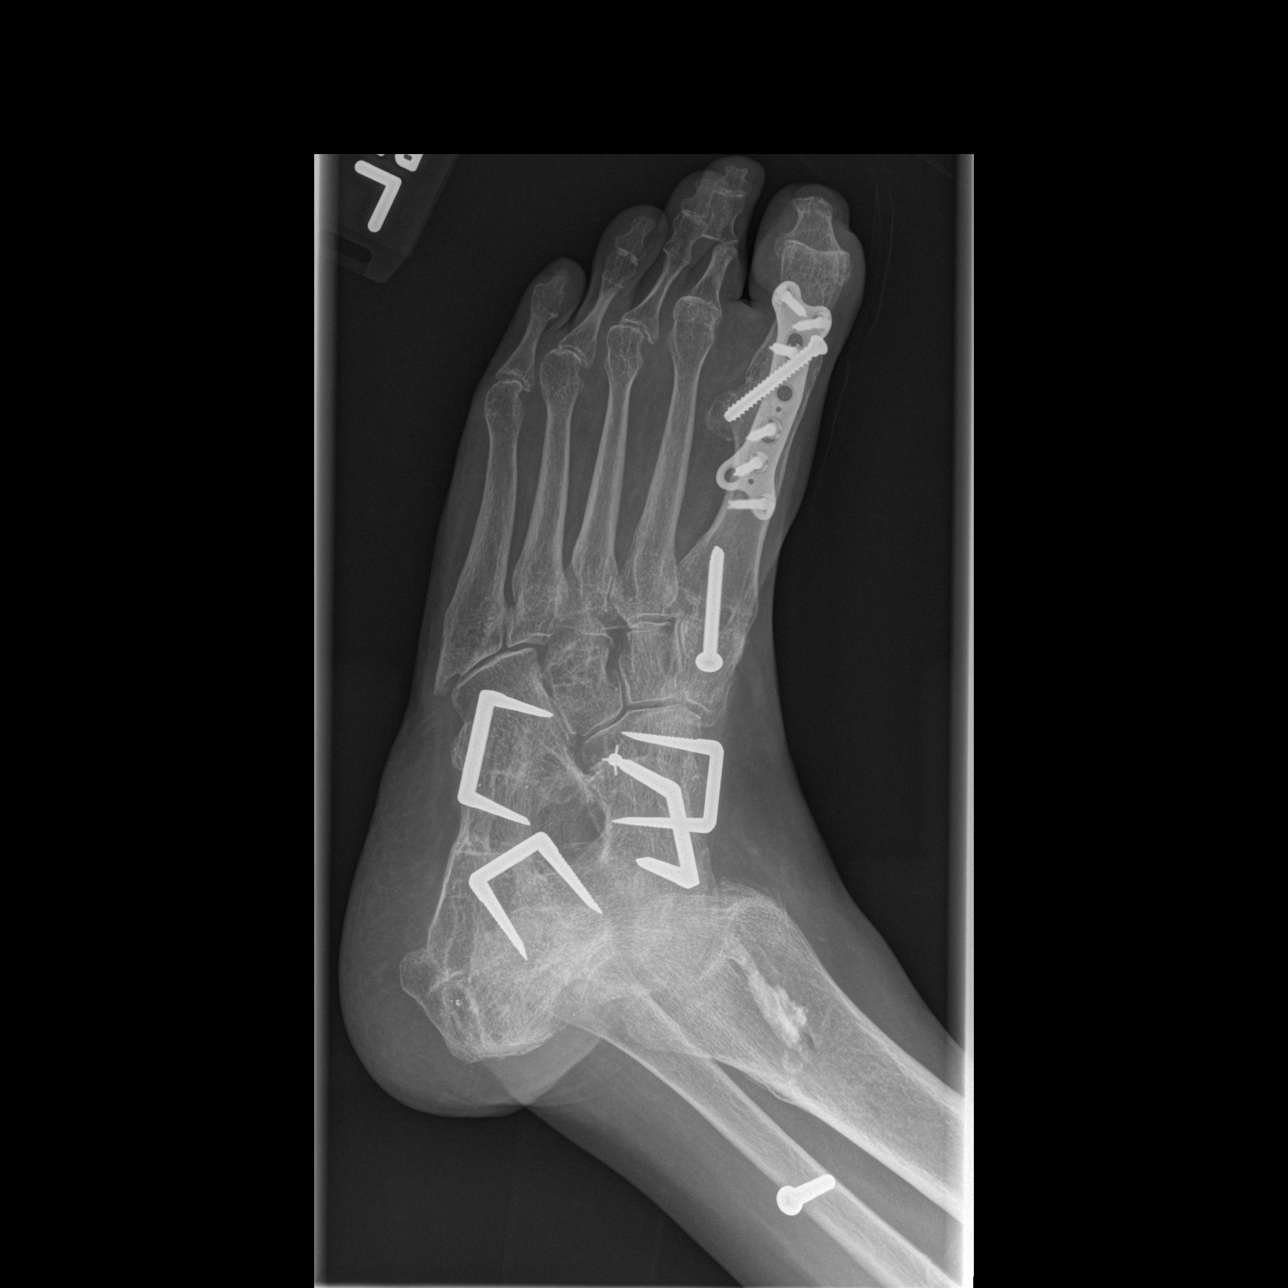

[t foot lat left]
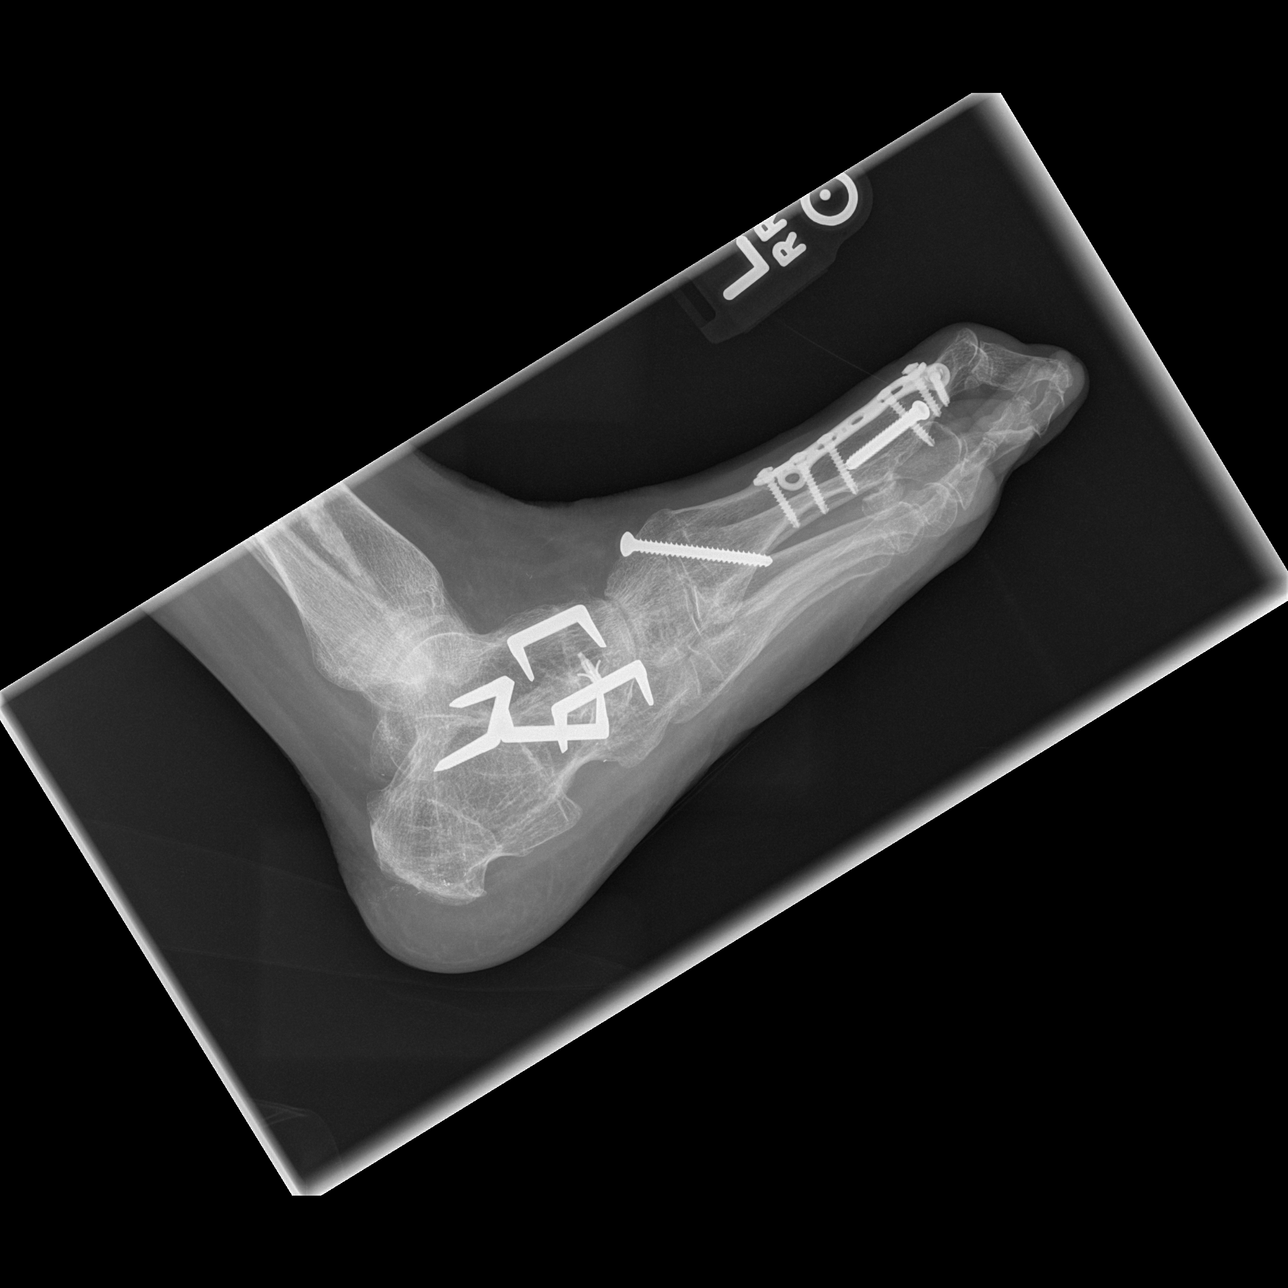

[3 of 3 positions shown; findings below may reference images not displayed]

FINDINGS: The bones are diffusely osteopenic. Extensive hardware throughout
the foot is unchanged, with significant midfoot and hindfoot
arthrodesis. Slight subluxation left 2nd toe PIP joint. No evidence
for great toe osteomyelitis. No change from priors. Pes planus.
IMPRESSION: No evidence for osteomyelitis.  Stable arthrodesis.

## 2015-10-24 ENCOUNTER — Emergency Department (HOSPITAL_BASED_OUTPATIENT_CLINIC_OR_DEPARTMENT_OTHER)
Admission: EM | Admit: 2015-10-24 | Discharge: 2015-10-24 | Disposition: A | Discharge status: Skilled Nursing Facility | Payer: Medicare Other | Attending: Emergency Medicine | Admitting: Emergency Medicine

## 2015-10-24 ENCOUNTER — Emergency Department (HOSPITAL_BASED_OUTPATIENT_CLINIC_OR_DEPARTMENT_OTHER): Payer: Medicare Other

## 2015-10-24 ENCOUNTER — Encounter (HOSPITAL_BASED_OUTPATIENT_CLINIC_OR_DEPARTMENT_OTHER): Payer: Self-pay

## 2015-10-24 DIAGNOSIS — I4891 Unspecified atrial fibrillation: Secondary | ICD-10-CM | POA: Insufficient documentation

## 2015-10-24 DIAGNOSIS — S0011XA Contusion of right eyelid and periocular area, initial encounter: Secondary | ICD-10-CM | POA: Diagnosis not present

## 2015-10-24 DIAGNOSIS — F329 Major depressive disorder, single episode, unspecified: Secondary | ICD-10-CM | POA: Insufficient documentation

## 2015-10-24 DIAGNOSIS — Z87891 Personal history of nicotine dependence: Secondary | ICD-10-CM | POA: Diagnosis not present

## 2015-10-24 DIAGNOSIS — Z79899 Other long term (current) drug therapy: Secondary | ICD-10-CM | POA: Insufficient documentation

## 2015-10-24 DIAGNOSIS — Y999 Unspecified external cause status: Secondary | ICD-10-CM | POA: Insufficient documentation

## 2015-10-24 DIAGNOSIS — Y939 Activity, unspecified: Secondary | ICD-10-CM | POA: Diagnosis not present

## 2015-10-24 DIAGNOSIS — M545 Low back pain, unspecified: Secondary | ICD-10-CM

## 2015-10-24 DIAGNOSIS — W19XXXA Unspecified fall, initial encounter: Secondary | ICD-10-CM | POA: Diagnosis not present

## 2015-10-24 DIAGNOSIS — Z79891 Long term (current) use of opiate analgesic: Secondary | ICD-10-CM | POA: Diagnosis not present

## 2015-10-24 DIAGNOSIS — Y929 Unspecified place or not applicable: Secondary | ICD-10-CM | POA: Diagnosis not present

## 2015-10-24 DIAGNOSIS — Z8673 Personal history of transient ischemic attack (TIA), and cerebral infarction without residual deficits: Secondary | ICD-10-CM | POA: Insufficient documentation

## 2015-10-24 DIAGNOSIS — S0990XA Unspecified injury of head, initial encounter: Secondary | ICD-10-CM

## 2015-10-24 DIAGNOSIS — T148XXA Other injury of unspecified body region, initial encounter: Secondary | ICD-10-CM

## 2015-10-24 DIAGNOSIS — S0033XA Contusion of nose, initial encounter: Secondary | ICD-10-CM | POA: Diagnosis not present

## 2015-10-24 DIAGNOSIS — Z7982 Long term (current) use of aspirin: Secondary | ICD-10-CM | POA: Insufficient documentation

## 2015-10-24 HISTORY — DX: Personal history of other diseases of the nervous system and sense organs: Z86.69

## 2015-10-24 HISTORY — DX: Hemiplegia, unspecified affecting unspecified side: G81.90

## 2015-10-24 HISTORY — DX: Unspecified dementia, unspecified severity, without behavioral disturbance, psychotic disturbance, mood disturbance, and anxiety: F03.90

## 2015-10-24 NOTE — ED Notes (Signed)
Pt states she lives assisted living at [facility]-pain [place] right forehead-no LOC-pt states she does recall events-[facility]to w/c then to tx area-pt states staff saw her fall however EMS reports pt was unwitnessed fall and called staff-pt is alert to self-place-confused to date and time-pt stood /small stepsfrom w/c-2 person assist to stretcher

## 2015-10-24 NOTE — ED Provider Notes (Signed)
CSN: [id number]     Arrival date & time 10/24/15  1833 History  By signing my name below, I, Soijett Blue, attest that this documentation has been prepared under the direction and in the presence of Alvira Monday, MD. Electronically Signed: Soijett Blue, ED Scribe. 10/24/2015. 7:46 PM.   Chief Complaint  Patient presents with  . Fall    The history is provided by the patient, the EMS personnel and the nursing home. No language interpreter was used.    [name] is a 75 y.o. female with a medical hx of hemiparesis, dementia, who presents to the Emergency Department via [facility] complaining of an unwitnessed fall onset PTA. Pt lives in assisted living at [facility]. Pt notes that she was sitting and she is unsure how she fell. Pt reports that she uses a wheelchair daily. Pt is having associated symptoms of resolved forehead pain. Was mild.  She notes that she has not tried any medications for the relief of her symptoms. She denies LOC, numbness, weakness, back pain, abdominal pain, and any other symptoms. Pt notes that she has had a stroke in the past and denies any PMHx. Pt notes that she had a stroke that affected her left side.   Past Medical History  Diagnosis Date  . CVA (cerebral vascular accident) (HCC)     Left hemiparesis, at the time of critical illness in the past  . ARDS (adult respiratory distress syndrome) (HCC)   . DIC (disseminated intravascular coagulation) (HCC)   . Anxiety   . Depression   . Hypothyroid   . Atrial fibrillation (HCC)     Paroxysmal, at times is severe illness in the past  . S/P IVC filter     Placed 2000 with severe illness  . Mitral regurgitation     Mild  . Chest pain     Nuclear, 2003, no ischemia  . Ejection fraction     EF 60%, echo, December, 2008  . Diastolic dysfunction   . Aortic insufficiency     Mild, echo, 2008  . Foot deformity     Congenitally abnormal left foot, multiple operations over time  . 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 IBS (irritable bowel syndrome)   . Dementia   . Anxiety   . History of hemiparesis   . Hemiplegia [facility])    Past Surgical History  Procedure Laterality Date  . Appendectomy    . Tonsillectomy    . Foot surgery      left foot multiple surgeries  . Bladder tack     No family history on file. Social History  Substance Use Topics  . Smoking status: Former Games developer  . Smokeless tobacco: Never Used  . Alcohol Use: No   OB History    No data available     Review of Systems  Constitutional: Negative for fever.  HENT: Negative for sore throat.        Resolved forehead pain  Eyes: Negative for visual disturbance.  Respiratory: Negative for cough and shortness of breath.   Cardiovascular: Negative for chest pain.  Gastrointestinal: Negative for abdominal pain.  Genitourinary: Negative for difficulty urinating.  Musculoskeletal: Positive for back pain. Negative for arthralgias and neck pain.  Skin: Negative for rash.  Neurological: Negative for syncope, weakness, numbness and headaches (resolved).    Allergies  Doxycycline  Home Medications   Prior to Admission medications   Medication Sig Start  Date End Date Taking? Authorizing Provider  acetaminophen (TYLENOL) 325 MG tablet Take 650 mg by mouth every 6 (six) hours as needed.   Yes Historical Provider, MD  aspirin 81 MG tablet Take 81 mg by mouth daily.   Yes Historical Provider, MD  cetirizine (ZYRTEC) 10 MG tablet Take 10 mg by mouth daily.   Yes Historical Provider, MD  Cholecalciferol (VITAMIN D3) 50000 units TABS Take by mouth.   Yes Historical Provider, MD  divalproex (DEPAKOTE) 250 MG DR tablet Take 250 mg by mouth 3 (three) times daily.   Yes Historical Provider, MD  lisinopril (PRINIVIL,ZESTRIL) 5 MG tablet Take 5 mg by mouth daily.   Yes Historical Provider, MD  QUEtiapine (SEROQUEL) 50 MG tablet Take 50 mg by mouth at bedtime.   Yes Historical Provider, MD   senna-docusate (SENOKOT-S) 8.6-50 MG tablet Take 1 tablet by mouth daily.   Yes Historical Provider, MD  alendronate (FOSAMAX) 70 MG tablet Take 70 mg by mouth every 7 (seven) days. Take with a full glass of water on an empty stomach.    Historical Provider, MD  Calcium Carbonate-Vitamin D (CALCIUM-VITAMIN D) 500-200 MG-UNIT per tablet Take 1 tablet by mouth daily.     Historical Provider, MD  clonazePAM (KLONOPIN) 1 MG tablet Take 1 mg by mouth 4 (four) times daily as needed. For anxiety    Historical Provider, MD  gabapentin (NEURONTIN) 100 MG capsule Take 100 mg by mouth 4 (four) times daily.  01/26/12   Historical Provider, MD  levothyroxine (SYNTHROID, LEVOTHROID) 75 MCG tablet Take 75 mcg by mouth daily.     Historical Provider, MD  omeprazole (PRILOSEC) 20 MG capsule Take 20 mg by mouth daily.  01/17/12   Historical Provider, MD  UNABLE TO FIND rx ointment to foot-unsure of name    Historical Provider, MD  venlafaxine XR (EFFEXOR-XR) 75 MG 24 hr capsule  03/12/13   Historical Provider, MD  zolpidem (AMBIEN CR) 6.25 MG CR tablet Take 3.125 mg by mouth at bedtime as needed. For sleep    Historical Provider, MD   BP 131/70 mmHg  Pulse 79  Temp(Src) 98.3 F (36.8 C) (Oral)  Resp 20  Ht 5\' 4"  (1.626 m)  Wt 130 lb (58.968 kg)  BMI 22.30 kg/m2  SpO2 97% Physical Exam  Constitutional: She is oriented to person, place, and time. She appears well-developed and well-nourished. No distress.  HENT:  Head: Normocephalic. Head is with contusion.  Nose: No nasal septal hematoma.  Mouth/Throat: No trismus in the jaw.  Supraorbital contusion superior to right eyebrow. Contusion over nasal bridge.  No trismus.  Eyes: EOM are normal. Pupils are equal, round, and reactive to light.  Neck: Neck supple.  Cardiovascular: Normal rate, regular rhythm and normal heart sounds.  Exam reveals no gallop and no friction rub.   No murmur heard. Pulmonary/Chest: Effort normal and breath sounds normal. No  respiratory distress. She has no wheezes. She has no rales.  Abdominal: Soft. She exhibits no distension. There is no tenderness.  Musculoskeletal: Normal range of motion. She exhibits tenderness.  No cervical or thoracic tenderness. Mild lumbar tenderness. Mild swelling to LLE. shortened left leg with good ROM. Good pulses to both feet. LUE weakness. Sensation intact to all extremities.   Neurological: She is alert and oriented to person, place, and time.  Mild pronator drift to the LUE.  Skin: Skin is warm and dry.  Healing ulcer over dorsum of left great toe.   Psychiatric: She has  a normal mood and affect. Her behavior is normal.  Nursing note and vitals reviewed.   ED Course  Procedures (including critical care time) DIAGNOSTIC STUDIES: Oxygen Saturation is 95% on RA, adequate by my interpretation.    COORDINATION OF CARE: 7:42 PM Discussed treatment plan with pt at bedside which includes CT head, lumbar spine xray and pt agreed to plan.    Labs Review Labs Reviewed - No data to display  Imaging Review Dg Lumbar Spine 2-3 Views  10/24/2015  CLINICAL DATA:  Pt is from a nursing home with an unwitnessed fall. Pt states she does not have any back pain. Hx of dementia. EXAM: LUMBAR SPINE - 2-3 VIEW COMPARISON:  07/25/2008 FINDINGS: No fracture.  No spondylolisthesis.  No bone lesion. There is straightening of the normal lumbar lordosis. Mild to moderate loss of disc height at L3-L4 with moderate to marked loss of disc height at L5-S1. Mild loss disc height at L4-L5. These degenerative disc changes have increased when compared the prior study. Mild dextroscoliosis on the AP view similar to the prior exam. Stable vena cava filter.  Soft tissues are otherwise unremarkable. IMPRESSION: 1. No fracture or acute finding. 2. Degenerative changes as described which have increased in severity when compared to the prior study. Electronically Signed   By: Amie Portland M.D.   On: 10/24/2015 20:23    Ct Head Wo Contrast  10/24/2015  CLINICAL DATA:  Fall.  Hit the right side of the face. EXAM: CT HEAD WITHOUT CONTRAST TECHNIQUE: Contiguous axial images were obtained from the base of the skull through the vertex without intravenous contrast. COMPARISON:  05/22/2014 FINDINGS: Diffuse cerebral atrophy is similar to the previous examination. Again noted is low-density in the periventricular white matter which appears unchanged. The right cerebral atrophy is more prominent than the left and suspect prior insult in the right cerebral hemisphere. No evidence for acute hemorrhage, mass lesion, midline shift, hydrocephalus or large new infarct. Visualized sinuses are clear. No calvarial fracture. IMPRESSION: No acute intracranial abnormality. Old right cerebral infarct. Stable atrophy and evidence for chronic small vessel ischemic changes. Electronically Signed   By: Richarda Overlie M.D.   On: 10/24/2015 19:30   I have personally reviewed and evaluated these images as part of my medical decision-making.   EKG Interpretation None      MDM   Final diagnoses:  Fall  Contusion  Head injury, initial encounter  Midline low back pain without sciatica   75 year old female with a history of CVA, depression, paroxysmal atrial fibrillation, dementia presents with concern for fall.  CT head shows no sign of acute fracture or bleed. Patient has no midline neck tenderness, neurologic exam at baseline, and have low suspicion for cervical spine injury. She also noted lumbar tenderness on exam, and an x-ray was ordered which showed no acute fracture. Patient otherwise appears at baseline, is alert, oriented, normal vital signs, and have low suspicion for other acute pathology. Reports chronic unchanged swelling and shortness to LLE from stroke, and has good ROM without pain.  Doubt other acute traumatic injury. Recommend PCP follow-up. Patient discharged in stable condition with understanding of reasons to return.   I  personally performed the services described in this documentation, which was scribed in my presence. The recorded information has been reviewed and is accurate.   Alvira Monday, MD 10/26/15 [phone]

## 2015-10-24 NOTE — ED Notes (Signed)
[facility] report-pt lives independent at [facility]-unwitnessed fall-pt called for assist-denied LOC, dizziness-bruise on forehead-no pain-no blood thinners-pt corrected EMS to state she lives in assisted-her fall was witnessed in someone's office-paper work with pt reads "fall,hit head on furniture-resident stated she wanted to go out to rule out possible concussion"

## 2015-10-24 NOTE — ED Notes (Signed)
Per [employer] pt is still on the list for [facility] to transport but unknown ETA d/t short staff and busy

## 2015-10-24 NOTE — ED Notes (Signed)
Pt is stating that she does not want to go back to [facility] because they make her see the doctor too much and use her money for it. Pt states her daughter is her POA, but is requesting resources on how to change that.

## 2015-10-24 NOTE — Discharge Instructions (Signed)

## 2015-10-24 NOTE — ED Notes (Signed)
MD at bedside. 

## 2015-10-24 NOTE — ED Notes (Signed)
When asked pt does state that she feels safe where she is at and that nobody harms her or threatens her.

## 2015-10-25 NOTE — ED Notes (Signed)
[facility] arrived to transport pt. Report given.

## 2020-05-14 DEATH — deceased
